# Patient Record
Sex: Male | Born: 1965 | Race: White | Hispanic: No | Marital: Married | Smoking: Former smoker
Health system: Southern US, Community
[De-identification: ages and names within clinical notes are randomized; demographics above are authoritative.]

## PROBLEM LIST (undated history)

## (undated) DIAGNOSIS — Z87442 Personal history of urinary calculi: Secondary | ICD-10-CM

## (undated) DIAGNOSIS — Z8719 Personal history of other diseases of the digestive system: Secondary | ICD-10-CM

## (undated) DIAGNOSIS — M1990 Unspecified osteoarthritis, unspecified site: Secondary | ICD-10-CM

## (undated) DIAGNOSIS — N400 Enlarged prostate without lower urinary tract symptoms: Secondary | ICD-10-CM

## (undated) HISTORY — DX: Benign prostatic hyperplasia without lower urinary tract symptoms: N40.0

## (undated) HISTORY — PX: TONSILLECTOMY: SUR1361

## (undated) HISTORY — PX: WISDOM TOOTH EXTRACTION: SHX21

## (undated) HISTORY — PX: HERNIA REPAIR: SHX51

---

## 1991-05-03 HISTORY — PX: HYDROCELE EXCISION / REPAIR: SUR1145

## 2007-05-03 HISTORY — PX: UMBILICAL HERNIA REPAIR: SHX196

## 2019-01-21 DIAGNOSIS — Z6824 Body mass index (BMI) 24.0-24.9, adult: Secondary | ICD-10-CM | POA: Diagnosis not present

## 2019-01-21 DIAGNOSIS — N5089 Other specified disorders of the male genital organs: Secondary | ICD-10-CM | POA: Diagnosis not present

## 2019-01-22 DIAGNOSIS — N4340 Spermatocele of epididymis, unspecified: Secondary | ICD-10-CM | POA: Diagnosis not present

## 2019-01-22 DIAGNOSIS — N5089 Other specified disorders of the male genital organs: Secondary | ICD-10-CM | POA: Diagnosis not present

## 2019-01-22 DIAGNOSIS — N521 Erectile dysfunction due to diseases classified elsewhere: Secondary | ICD-10-CM | POA: Diagnosis not present

## 2019-01-22 DIAGNOSIS — N50812 Left testicular pain: Secondary | ICD-10-CM | POA: Diagnosis not present

## 2019-02-05 DIAGNOSIS — N503 Cyst of epididymis: Secondary | ICD-10-CM | POA: Diagnosis not present

## 2019-02-05 DIAGNOSIS — I861 Scrotal varices: Secondary | ICD-10-CM | POA: Diagnosis not present

## 2019-02-06 DIAGNOSIS — N5089 Other specified disorders of the male genital organs: Secondary | ICD-10-CM | POA: Diagnosis not present

## 2019-02-06 DIAGNOSIS — Z6826 Body mass index (BMI) 26.0-26.9, adult: Secondary | ICD-10-CM | POA: Diagnosis not present

## 2019-02-28 DIAGNOSIS — N50812 Left testicular pain: Secondary | ICD-10-CM | POA: Diagnosis not present

## 2019-02-28 DIAGNOSIS — N528 Other male erectile dysfunction: Secondary | ICD-10-CM | POA: Diagnosis not present

## 2019-02-28 DIAGNOSIS — N4340 Spermatocele of epididymis, unspecified: Secondary | ICD-10-CM | POA: Diagnosis not present

## 2019-02-28 DIAGNOSIS — I861 Scrotal varices: Secondary | ICD-10-CM | POA: Diagnosis not present

## 2019-07-15 ENCOUNTER — Encounter: Payer: Self-pay | Admitting: Emergency Medicine

## 2019-07-15 ENCOUNTER — Other Ambulatory Visit: Payer: Self-pay

## 2019-07-15 ENCOUNTER — Ambulatory Visit (INDEPENDENT_AMBULATORY_CARE_PROVIDER_SITE_OTHER): Payer: Worker's Compensation

## 2019-07-15 ENCOUNTER — Ambulatory Visit
Admission: EM | Admit: 2019-07-15 | Discharge: 2019-07-15 | Disposition: A | Discharge status: Home / Self Care | Payer: Worker's Compensation | Attending: Family Medicine | Admitting: Family Medicine

## 2019-07-15 DIAGNOSIS — M25522 Pain in left elbow: Secondary | ICD-10-CM | POA: Diagnosis not present

## 2019-07-15 DIAGNOSIS — Z042 Encounter for examination and observation following work accident: Secondary | ICD-10-CM

## 2019-07-15 DIAGNOSIS — S7012XA Contusion of left thigh, initial encounter: Secondary | ICD-10-CM

## 2019-07-15 DIAGNOSIS — T07XXXA Unspecified multiple injuries, initial encounter: Secondary | ICD-10-CM | POA: Diagnosis not present

## 2019-07-15 DIAGNOSIS — S40012A Contusion of left shoulder, initial encounter: Secondary | ICD-10-CM

## 2019-07-15 DIAGNOSIS — M25512 Pain in left shoulder: Secondary | ICD-10-CM

## 2019-07-15 DIAGNOSIS — W108XXA Fall (on) (from) other stairs and steps, initial encounter: Secondary | ICD-10-CM

## 2019-07-15 DIAGNOSIS — M79605 Pain in left leg: Secondary | ICD-10-CM

## 2019-07-15 DIAGNOSIS — S5002XA Contusion of left elbow, initial encounter: Secondary | ICD-10-CM

## 2019-07-15 MED ORDER — MELOXICAM 15 MG PO TABS: 15.0000 mg | ORAL_TABLET | Freq: Every day | ORAL | 0 refills | Status: DC

## 2019-07-15 NOTE — ED Triage Notes (Signed)
Pt was at work and fell off steps coming from a machine. His left shoulder, elbow and thigh are hurting. He can move his joints but is painful.

## 2019-07-15 NOTE — ED Provider Notes (Signed)
[facility]    CSN: [id number] Arrival date & time: 07/15/19  1802      History   Chief Complaint Chief Complaint  Patient presents with  . Fall    w/c    HPI Timothy Cook is a 54 y.o. male.   HPI  54 year old male today at work, was coming down some stairs on a machine when he hit the bottom stairs he stepped off and slipped on a wet slippery area on the concrete.  He fell suddenly onto his left shoulder elbow and thigh.  He states at first it was not hurting as badly as it is now.  He has been able to walk.  He denies any injury to his head had no loss of consciousness.        History reviewed. No pertinent past medical history.  There are no problems to display for this patient.   Past Surgical History:  Procedure Laterality Date  . HERNIA REPAIR    . HYDROCELE EXCISION / REPAIR    . TONSILLECTOMY         Home Medications    Prior to Admission medications   Medication Sig Start Date End Date Taking? Authorizing Provider  meloxicam (MOBIC) 15 MG tablet Take 1 tablet (15 mg total) by mouth daily. Take with food. 07/15/19   Lorin Picket, PA-C    Family History Family History  Problem Relation Age of Onset  . Cancer Mother        lung, Liver and bone  . Heart attack Mother   . Cancer Father        prostate    Social History Social History   Tobacco Use  . Smoking status: Current Every Day Smoker    Packs/day: 0.75  . Smokeless tobacco: Never Used  Substance Use Topics  . Alcohol use: Not Currently  . Drug use: Not Currently     Allergies   Patient has no known allergies.   Review of Systems Review of Systems  Constitutional: Positive for activity change. Negative for appetite change, chills, diaphoresis, fatigue and fever.  Musculoskeletal: Positive for arthralgias and gait problem.  All other systems reviewed and are negative.    Physical Exam Triage Vital Signs ED Triage Vitals  Enc Vitals Group     BP 07/15/19  1841 140/76     Pulse Rate 07/15/19 1841 (!) 57     Resp 07/15/19 1841 18     Temp 07/15/19 1841 97.9 F (36.6 C)     Temp Source 07/15/19 1841 Oral     SpO2 07/15/19 1841 98 %     Weight 07/15/19 1835 202 lb (91.6 kg)     Height 07/15/19 1835 6\' 2"  (1.88 m)     Head Circumference --      Peak Flow --      Pain Score 07/15/19 1835 6     Pain Loc --      Pain Edu? --      Excl. in [place]? --    No data found.  Updated Vital Signs BP 140/76 (BP Location: Left Arm)   Pulse (!) 57   Temp 97.9 F (36.6 C) (Oral)   Resp 18   Ht 6\' 2"  (1.88 m)   Wt 202 lb (91.6 kg)   SpO2 98%   BMI 25.94 kg/m   Visual Acuity Right Eye Distance:   Left Eye Distance:   Bilateral Distance:    Right Eye Near:   Left  Eye Near:    Bilateral Near:     Physical Exam Vitals and nursing note reviewed.  Constitutional:      General: He is not in acute distress.    Appearance: Normal appearance. He is normal weight. He is not ill-appearing or toxic-appearing.  HENT:     Head: Normocephalic and atraumatic.  Eyes:     Conjunctiva/sclera: Conjunctivae normal.  Musculoskeletal:        General: Tenderness and signs of injury present. No swelling or deformity.     Cervical back: Tenderness present.     Comments: Examination shows the patient to be in no acute distress.  He does favor his left side by holding his shoulder in a abducted and internally rotated posture.  He does show a moderate antalgic gait on the left.  No deformities seen.  There is no open areas of his integumentary.  Lamination of the cervical spine shows a fairly good range of motion with discomfort at the extremes of extension and flexion and leftward rotation.  The patient states that this has been a problem prior to the [employer] injury with neck pain and particularly in the left-sided paraspinous muscles.  Lamination of the apical shows no deformity no tenderness.  He has no tenderness about the [facility] joint or the anterior shoulder.   Is tenderness over the anterior scapular spine and the deep muscles.  Range of motion of the shoulder is to be restricted by patient resisting full abduction.  Has good rotation internal and external.  Lamination of the elbow shows no deformity.  There is no ecchymosis or bruising at the present time.  He has good extension and flexion pronation supination.  He has tenderness over the medial epicondyle and a Tinel's sign of the ulnar nerve in the groove with radiation into his ulnar forearm.  Examination of the lumbar spine shows no significant tenderness present.  There is no tenderness of the anterior superior iliac crests.  His hip shows fairly good range of motion to internal and external rotation.  He is tender along the lateral aspect of the femur at the midportion.  There is no deformity no crepitus present.  Skin:    General: Skin is warm and dry.  Neurological:     General: No focal deficit present.     Mental Status: He is alert and oriented to person, place, and time.  Psychiatric:        Mood and Affect: Mood normal.        Behavior: Behavior normal.        Thought Content: Thought content normal.        Judgment: Judgment normal.      UC Treatments / Results  Labs (all labs ordered are listed, but only abnormal results are displayed) Labs Reviewed - No data to display  EKG   Radiology DG Elbow Complete Left  Result Date: 07/15/2019 CLINICAL DATA:  54 year old male with fall and trauma to the left upper extremity. EXAM: LEFT SHOULDER - 2+ VIEW; LEFT ELBOW - COMPLETE 3+ VIEW COMPARISON:  None. FINDINGS: There is no acute fracture or dislocation. The bones are well mineralized. No significant arthritic changes. No joint effusion. The soft tissues are unremarkable. IMPRESSION: Negative. Electronically Signed   By: Anner Crete M.D.   On: 07/15/2019 19:51   DG Shoulder Left  Result Date: 07/15/2019 CLINICAL DATA:  54 year old male with fall and trauma to the left upper  extremity. EXAM: LEFT SHOULDER - 2+ VIEW; LEFT  ELBOW - COMPLETE 3+ VIEW COMPARISON:  None. FINDINGS: There is no acute fracture or dislocation. The bones are well mineralized. No significant arthritic changes. No joint effusion. The soft tissues are unremarkable. IMPRESSION: Negative. Electronically Signed   By: Anner Crete M.D.   On: 07/15/2019 19:51   DG Femur Min 2 Views Left  Result Date: 07/15/2019 CLINICAL DATA:  54 year old male with fall and trauma to the left lower extremity. EXAM: LEFT FEMUR 2 VIEWS COMPARISON:  None. FINDINGS: There is no acute fracture or dislocation. The bones are well mineralized. No significant arthritic changes. No joint effusion. There is a 2.5 cm ovoid calcific density over the pelvis which is not characterized but may represent a bladder stone. Bladder ultrasound may provide better evaluation. The soft tissues are unremarkable. IMPRESSION: 1. No acute fracture or dislocation. 2. Ovoid calcific density over the pelvis may represent a bladder stone. Electronically Signed   By: Anner Crete M.D.   On: 07/15/2019 19:53    Procedures Procedures (including critical care time)  Medications Ordered in UC Medications - No data to display  Initial Impression / Assessment and Plan / UC Course  I have reviewed the triage vital signs and the nursing notes.  Pertinent labs & imaging results that were available during my care of the patient were reviewed by me and considered in my medical decision making (see chart for details).   54 year old male presents today after falling at work onto his left side injuring his shoulder elbow and thigh.  Physical exam showed no significant contusions or ecchymosis.  There are no breaks in the skin.. I independently reviewed the x-rays of the elbow femur and shoulder.  No fractures or dislocations were seen.  Incidentally was seen a calcification possibly a bladder stone.  These were confirmed by radiology later.  I had discussed  the x-rays with the patient.  I will provide him with Mobic for pain control.  He will ice the areas as necessary for pain control.  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  He continues to have problems he will follow-up with [facility] occupational health. Final Clinical Impressions(s) / UC Diagnoses   Final diagnoses:  Multiple contusions     Discharge Instructions     Use ice 20 minutes every 2 hours 4-5 times daily on all areas that are painful.  Take meloxicam with food.  Return to work on 07/17/2019.  If you continue to have pain follow-up with the occupational health at [facility] as noted above.    ED Prescriptions    Medication Sig Dispense Auth. Provider   meloxicam (MOBIC) 15 MG tablet Take 1 tablet (15 mg total) by mouth daily. Take with food. 30 tablet Lorin Picket, PA-C     PDMP not reviewed this encounter.   Lorin Picket, PA-C 07/15/19 2024

## 2019-07-15 NOTE — Discharge Instructions (Addendum)
Use ice 20 minutes every 2 hours 4-5 times daily on all areas that are painful.  Take meloxicam with food.  Return to work on 07/17/2019.  If you continue to have pain follow-up with the occupational health at [facility] as noted above.

## 2019-08-01 DIAGNOSIS — Z23 Encounter for immunization: Secondary | ICD-10-CM | POA: Diagnosis not present

## 2019-08-23 DIAGNOSIS — Z23 Encounter for immunization: Secondary | ICD-10-CM | POA: Diagnosis not present

## 2019-12-27 ENCOUNTER — Ambulatory Visit: Payer: Self-pay | Admitting: Family Medicine

## 2020-01-07 ENCOUNTER — Telehealth: Payer: Self-pay | Admitting: Family Medicine

## 2020-01-07 DIAGNOSIS — Z125 Encounter for screening for malignant neoplasm of prostate: Secondary | ICD-10-CM

## 2020-01-07 DIAGNOSIS — Z13220 Encounter for screening for lipoid disorders: Secondary | ICD-10-CM

## 2020-01-07 DIAGNOSIS — Z79899 Other long term (current) drug therapy: Secondary | ICD-10-CM

## 2020-01-07 NOTE — Telephone Encounter (Signed)
Pt has cpe scheduled 11/1 and would like lab work done before appt.

## 2020-01-07 NOTE — Telephone Encounter (Signed)
No labs in system

## 2020-01-07 NOTE — Telephone Encounter (Signed)
Lipid, CMP, PSA, CBC

## 2020-01-08 NOTE — Telephone Encounter (Signed)
Lab orders placed. Attempted to contact pt but voicemail is full

## 2020-01-10 ENCOUNTER — Ambulatory Visit: Payer: Self-pay | Admitting: Family Medicine

## 2020-03-02 ENCOUNTER — Ambulatory Visit (INDEPENDENT_AMBULATORY_CARE_PROVIDER_SITE_OTHER): Payer: BC Managed Care – PPO | Admitting: Family Medicine

## 2020-03-02 ENCOUNTER — Other Ambulatory Visit: Payer: Self-pay

## 2020-03-02 VITALS — BP 118/78 | Temp 98.1°F | Ht 74.0 in | Wt 197.8 lb

## 2020-03-02 DIAGNOSIS — Z13220 Encounter for screening for lipoid disorders: Secondary | ICD-10-CM | POA: Diagnosis not present

## 2020-03-02 DIAGNOSIS — N529 Male erectile dysfunction, unspecified: Secondary | ICD-10-CM

## 2020-03-02 DIAGNOSIS — Z0000 Encounter for general adult medical examination without abnormal findings: Secondary | ICD-10-CM | POA: Diagnosis not present

## 2020-03-02 DIAGNOSIS — R3912 Poor urinary stream: Secondary | ICD-10-CM

## 2020-03-02 DIAGNOSIS — Z1211 Encounter for screening for malignant neoplasm of colon: Secondary | ICD-10-CM

## 2020-03-02 DIAGNOSIS — Z720 Tobacco use: Secondary | ICD-10-CM

## 2020-03-02 DIAGNOSIS — N401 Enlarged prostate with lower urinary tract symptoms: Secondary | ICD-10-CM | POA: Insufficient documentation

## 2020-03-02 DIAGNOSIS — Z79899 Other long term (current) drug therapy: Secondary | ICD-10-CM | POA: Diagnosis not present

## 2020-03-02 DIAGNOSIS — Z125 Encounter for screening for malignant neoplasm of prostate: Secondary | ICD-10-CM | POA: Diagnosis not present

## 2020-03-02 MED ORDER — TADALAFIL 5 MG PO TABS: ORAL_TABLET | ORAL | 5 refills | Status: DC

## 2020-03-02 MED ORDER — SILDENAFIL CITRATE 100 MG PO TABS: 50.0000 mg | ORAL_TABLET | Freq: Every day | ORAL | 11 refills | Status: DC | PRN

## 2020-03-02 MED ORDER — BUPROPION HCL ER (SR) 150 MG PO TB12: 150.0000 mg | ORAL_TABLET | Freq: Two times a day (BID) | ORAL | 2 refills | Status: DC
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
## 2020-03-02 NOTE — Patient Instructions (Signed)
Steps to Quit Smoking Smoking tobacco is the leading cause of preventable death. It can affect almost every organ in the body. Smoking puts you and people around you at risk for many serious, long-lasting (chronic) diseases. Quitting smoking can be hard, but it is one of the best things that you can do for your health. It is never too late to quit. How do I get ready to quit? When you decide to quit smoking, make a plan to help you succeed. Before you quit:  Pick a date to quit. Set a date within the next 2 weeks to give you time to prepare.  Write down the reasons why you are quitting. Keep this list in places where you will see it often.  Tell your family, friends, and co-workers that you are quitting. Their support is important.  Talk with your doctor about the choices that may help you quit.  Find out if your health insurance will pay for these treatments.  Know the people, places, things, and activities that make you want to smoke (triggers). Avoid them. What first steps can I take to quit smoking?  Throw away all cigarettes at home, at work, and in your car.  Throw away the things that you use when you smoke, such as ashtrays and lighters.  Clean your car. Make sure to empty the ashtray.  Clean your home, including curtains and carpets. What can I do to help me quit smoking? Talk with your doctor about taking medicines and seeing a counselor at the same time. You are more likely to succeed when you do both.  If you are pregnant or breastfeeding, talk with your doctor about counseling or other ways to quit smoking. Do not take medicine to help you quit smoking unless your doctor tells you to do so. To quit smoking: Quit right away  Quit smoking totally, instead of slowly cutting back on how much you smoke over a period of time.  Go to counseling. You are more likely to quit if you go to counseling sessions regularly. Take medicine You may take medicines to help you quit. Some  medicines need a prescription, and some you can buy over-the-counter. Some medicines may contain a drug called nicotine to replace the nicotine in cigarettes. Medicines may:  Help you to stop having the desire to smoke (cravings).  Help to stop the problems that come when you stop smoking (withdrawal symptoms). Your doctor may ask you to use:  Nicotine patches, gum, or lozenges.  Nicotine inhalers or sprays.  Non-nicotine medicine that is taken by mouth. Find resources Find resources and other ways to help you quit smoking and remain smoke-free after you quit. These resources are most helpful when you use them often. They include:  Online chats with a counselor.  Phone quitlines.  Printed self-help materials.  Support groups or group counseling.  Text messaging programs.  Mobile phone apps. Use apps on your mobile phone or tablet that can help you stick to your quit plan. There are many free apps for mobile phones and tablets as well as websites. Examples include Quit Guide from the CDC and smokefree.gov  What things can I do to make it easier to quit?   Talk to your family and friends. Ask them to support and encourage you.  Call a phone quitline (1-800-QUIT-NOW), reach out to support groups, or work with a counselor.  Ask people who smoke to not smoke around you.  Avoid places that make you want to smoke,   such as: ? Bars. ? Parties. ? Smoke-break areas at work.  Spend time with people who do not smoke.  Lower the stress in your life. Stress can make you want to smoke. Try these things to help your stress: ? Getting regular exercise. ? Doing deep-breathing exercises. ? Doing yoga. ? Meditating. ? Doing a body scan. To do this, close your eyes, focus on one area of your body at a time from head to toe. Notice which parts of your body are tense. Try to relax the muscles in those areas. How will I feel when I quit smoking? Day 1 to 3 weeks Within the first 24 hours,  you may start to have some problems that come from quitting tobacco. These problems are very bad 2-3 days after you quit, but they do not often last for more than 2-3 weeks. You may get these symptoms:  Mood swings.  Feeling restless, nervous, angry, or annoyed.  Trouble concentrating.  Dizziness.  Strong desire for high-sugar foods and nicotine.  Weight gain.  Trouble pooping (constipation).  Feeling like you may vomit (nausea).  Coughing or a sore throat.  Changes in how the medicines that you take for other issues work in your body.  Depression.  Trouble sleeping (insomnia). Week 3 and afterward After the first 2-3 weeks of quitting, you may start to notice more positive results, such as:  Better sense of smell and taste.  Less coughing and sore throat.  Slower heart rate.  Lower blood pressure.  Clearer skin.  Better breathing.  Fewer sick days. Quitting smoking can be hard. Do not give up if you fail the first time. Some people need to try a few times before they succeed. Do your best to stick to your quit plan, and talk with your doctor if you have any questions or concerns. Summary  Smoking tobacco is the leading cause of preventable death. Quitting smoking can be hard, but it is one of the best things that you can do for your health.  When you decide to quit smoking, make a plan to help you succeed.  Quit smoking right away, not slowly over a period of time.  When you start quitting, seek help from your doctor, family, or friends. This information is not intended to replace advice given to you by your health care provider. Make sure you discuss any questions you have with your health care provider. Document Revised: 01/11/2019 Document Reviewed: 07/07/2018 Elsevier Patient Education  2020 [employer] Inc[employer]  

## 2020-03-02 NOTE — Addendum Note (Signed)
Addended by: Dairl Ponder on: 03/02/2020 03:52 PM   Modules accepted: Orders

## 2020-03-03 LAB — CBC WITH DIFFERENTIAL/PLATELET
Basophils Absolute: 0.1 10*3/uL (ref 0.0–0.2)
Basos: 1 %
EOS (ABSOLUTE): 0.2 10*3/uL (ref 0.0–0.4)
Eos: 1 %
Hematocrit: 49.9 % (ref 37.5–51.0)
Hemoglobin: 17 g/dL (ref 13.0–17.7)
Immature Grans (Abs): 0.3 10*3/uL — ABNORMAL HIGH (ref 0.0–0.1)
Immature Granulocytes: 2 %
Lymphocytes Absolute: 1.7 10*3/uL (ref 0.7–3.1)
Lymphs: 11 %
MCH: 31.8 pg (ref 26.6–33.0)
MCHC: 34.1 g/dL (ref 31.5–35.7)
MCV: 93 fL (ref 79–97)
Monocytes Absolute: 1.2 10*3/uL — ABNORMAL HIGH (ref 0.1–0.9)
Monocytes: 8 %
Neutrophils Absolute: 11.9 10*3/uL — ABNORMAL HIGH (ref 1.4–7.0)
Neutrophils: 77 %
RBC: 5.35 x10E6/uL (ref 4.14–5.80)
RDW: 12.5 % (ref 11.6–15.4)
WBC: 15.4 10*3/uL — ABNORMAL HIGH (ref 3.4–10.8)

## 2020-03-03 LAB — COMPREHENSIVE METABOLIC PANEL
ALT: 10 IU/L (ref 0–44)
AST: 15 IU/L (ref 0–40)
Albumin/Globulin Ratio: 1.7 (ref 1.2–2.2)
Albumin: 4.5 g/dL (ref 3.8–4.9)
Alkaline Phosphatase: 62 IU/L (ref 44–121)
BUN/Creatinine Ratio: 12 (ref 9–20)
BUN: 14 mg/dL (ref 6–24)
Bilirubin Total: 0.3 mg/dL (ref 0.0–1.2)
CO2: 26 mmol/L (ref 20–29)
Calcium: 9.5 mg/dL (ref 8.7–10.2)
Chloride: 100 mmol/L (ref 96–106)
Creatinine, Ser: 1.15 mg/dL (ref 0.76–1.27)
GFR calc Af Amer: 82 mL/min/{1.73_m2} (ref >59)
GFR calc non Af Amer: 71 mL/min/{1.73_m2} (ref >59)
Globulin, Total: 2.6 g/dL (ref 1.5–4.5)
Glucose: 98 mg/dL (ref 65–99)
Potassium: 4.7 mmol/L (ref 3.5–5.2)
Sodium: 139 mmol/L (ref 134–144)
Total Protein: 7.1 g/dL (ref 6.0–8.5)

## 2020-03-03 LAB — LIPID PANEL
Chol/HDL Ratio: 5.6 ratio — ABNORMAL HIGH (ref 0.0–5.0)
Cholesterol, Total: 248 mg/dL — ABNORMAL HIGH (ref 100–199)
HDL: 44 mg/dL (ref >39)
LDL Chol Calc (NIH): 188 mg/dL — ABNORMAL HIGH (ref 0–99)
Triglycerides: 91 mg/dL (ref 0–149)
VLDL Cholesterol Cal: 16 mg/dL (ref 5–40)

## 2020-03-03 LAB — PSA: Prostate Specific Ag, Serum: 0.6 ng/mL (ref 0.0–4.0)

## 2020-03-19 ENCOUNTER — Encounter: Payer: Self-pay | Admitting: Internal Medicine

## 2020-03-24 ENCOUNTER — Ambulatory Visit: Payer: BC Managed Care – PPO | Admitting: Family Medicine

## 2020-04-15 ENCOUNTER — Ambulatory Visit: Payer: BC Managed Care – PPO

## 2020-04-15 ENCOUNTER — Other Ambulatory Visit: Payer: Self-pay

## 2020-04-15 VITALS — Ht 74.0 in | Wt 201.0 lb

## 2020-04-15 DIAGNOSIS — Z1211 Encounter for screening for malignant neoplasm of colon: Secondary | ICD-10-CM

## 2020-04-15 MED ORDER — NA SULFATE-K SULFATE-MG SULF 17.5-3.13-1.6 GM/177ML PO SOLN: 1.0000 | Freq: Once | ORAL | 0 refills | Status: AC

## 2020-04-15 NOTE — Progress Notes (Signed)
No egg or soy allergy known to patient  No issues with past sedation with any surgeries or procedures No intubation problems in the past  No FH of Malignant Hyperthermia No diet pills per patient No home 02 use per patient  No blood thinners per patient  Pt denies issues with constipation  No A fib or A flutter  EMMI video via [place] 19 guidelines implemented in PV today with Pt and RN  Pt is fully vaccinated  for Covid  Coupon given to pt in PV today , Code to Pharmacy   Due to the COVID-19 pandemic we are asking patients to follow certain guidelines.  Pt aware of COVID protocols and [facility] guidelines

## 2020-04-20 ENCOUNTER — Encounter: Payer: Self-pay | Admitting: Family Medicine

## 2020-04-20 ENCOUNTER — Ambulatory Visit (INDEPENDENT_AMBULATORY_CARE_PROVIDER_SITE_OTHER): Payer: BC Managed Care – PPO | Admitting: Family Medicine

## 2020-04-20 ENCOUNTER — Other Ambulatory Visit: Payer: Self-pay

## 2020-04-20 VITALS — BP 126/88 | HR 74 | Temp 97.9°F | Wt 211.8 lb

## 2020-04-20 DIAGNOSIS — D72829 Elevated white blood cell count, unspecified: Secondary | ICD-10-CM | POA: Diagnosis not present

## 2020-04-20 DIAGNOSIS — E785 Hyperlipidemia, unspecified: Secondary | ICD-10-CM | POA: Diagnosis not present

## 2020-04-20 MED ORDER — TADALAFIL 5 MG PO TABS: ORAL_TABLET | ORAL | 5 refills | Status: DC

## 2020-04-20 NOTE — Patient Instructions (Addendum)
Please do lipid profile in 3 months at [place] [facility] Wellbutrin We will look into Chantix to see if it is still under recall  Results for orders placed or performed in visit on 01/07/20  Lipid Profile  Result Value Ref Range   Cholesterol, Total 248 (H) 100 - 199 mg/dL   Triglycerides 91 0 - 149 mg/dL   HDL 44 >39 mg/dL   VLDL Cholesterol Cal 16 5 - 40 mg/dL   LDL Chol Calc (NIH) 188 (H) 0 - 99 mg/dL   Chol/HDL Ratio 5.6 (H) 0.0 - 5.0 ratio  Comprehensive Metabolic Panel (CMET)  Result Value Ref Range   Glucose 98 65 - 99 mg/dL   BUN 14 6 - 24 mg/dL   Creatinine, Ser 1.15 0.76 - 1.27 mg/dL   GFR calc non Af Amer 71 >59 mL/min/1.73   GFR calc Af Amer 82 >59 mL/min/1.73   BUN/Creatinine Ratio 12 9 - 20   Sodium 139 134 - 144 mmol/L   Potassium 4.7 3.5 - 5.2 mmol/L   Chloride 100 96 - 106 mmol/L   CO2 26 20 - 29 mmol/L   Calcium 9.5 8.7 - 10.2 mg/dL   Total Protein 7.1 6.0 - 8.5 g/dL   Albumin 4.5 3.8 - 4.9 g/dL   Globulin, Total 2.6 1.5 - 4.5 g/dL   Albumin/Globulin Ratio 1.7 1.2 - 2.2   Bilirubin Total 0.3 0.0 - 1.2 mg/dL   Alkaline Phosphatase 62 44 - 121 IU/L   AST 15 0 - 40 IU/L   ALT 10 0 - 44 IU/L  PSA  Result Value Ref Range   Prostate Specific Ag, Serum 0.6 0.0 - 4.0 ng/mL  CBC with Differential  Result Value Ref Range   WBC 15.4 (H) 3.4 - 10.8 x10E3/uL   RBC 5.35 4.14 - 5.80 x10E6/uL   Hemoglobin 17.0 13.0 - 17.7 g/dL   Hematocrit 49.9 37.5 - 51.0 %   MCV 93 79 - 97 fL   MCH 31.8 26.6 - 33.0 pg   MCHC 34.1 31.5 - 35.7 g/dL   RDW 12.5 11.6 - 15.4 %   Platelets CANCELED x10E3/uL   Neutrophils 77 Not Estab. %   Lymphs 11 Not Estab. %   Monocytes 8 Not Estab. %   Eos 1 Not Estab. %   Basos 1 Not Estab. %   Neutrophils Absolute 11.9 (H) 1.4 - 7.0 x10E3/uL   Lymphocytes Absolute 1.7 0.7 - 3.1 x10E3/uL   Monocytes Absolute 1.2 (H) 0.1 - 0.9 x10E3/uL   EOS (ABSOLUTE) 0.2 0.0 - 0.4 x10E3/uL   Basophils Absolute 0.1 0.0 - 0.2 x10E3/uL   Immature Granulocytes 2  Not Estab. %   Immature Grans (Abs) 0.3 (H) 0.0 - 0.1 x10E3/uL   Hematology Comments: Note:       Preventing High Cholesterol Cholesterol is a white, waxy substance similar to fat that the human body needs to help build cells. The liver makes all the cholesterol that a person's body needs. Having high cholesterol (hypercholesterolemia) increases a person's risk for heart disease and stroke. Extra (excess) cholesterol comes from the food the person eats. High cholesterol can often be prevented with diet and lifestyle changes. If you already have high cholesterol, you can control it with diet and lifestyle changes and with medicine. How can high cholesterol affect me? If you have high cholesterol, deposits (plaques) may build up on the walls of your arteries. The arteries are the blood vessels that carry blood away from your heart. Plaques make  the arteries narrower and stiffer. This can limit or block blood flow and cause blood clots to form. Blood clots:  Are tiny balls of cells that form in your blood.  Can move to the heart or brain, causing a heart attack or stroke. Plaques in arteries greatly increase your risk for heart attack and stroke.Making diet and lifestyle changes can reduce your risk for these conditions that may threaten your life. What can increase my risk? This condition is more likely to develop in people who:  Eat foods that are high in saturated fat or cholesterol. Saturated fat is mostly found in: ? Foods that contain animal fat, such as red meat and some dairy products. ? Certain fatty foods made from plants, such as tropical oils.  Are overweight.  Are not getting enough exercise.  Have a family history of high cholesterol. What actions can I take to prevent this? Nutrition   Eat less saturated fat.  Avoid trans fats (partially hydrogenated oils). These are often found in margarine and in some baked goods, fried foods, and snacks bought in packages.  Avoid  precooked or cured meat, such as sausages or meat loaves.  Avoid foods and drinks that have added sugars.  Eat more fruits, vegetables, and whole grains.  Choose healthy sources of protein, such as fish, poultry, lean cuts of red meat, beans, peas, lentils, and nuts.  Choose healthy sources of fat, such as: ? Nuts. ? Vegetable oils, especially olive oil. ? Fish that have healthy fats (omega-3 fatty acids), such as mackerel or salmon. The items listed above may not be a complete list of recommended foods and beverages. Contact a dietitian for more information. Lifestyle  Lose weight if you are overweight. Losing 5-10 lb (2.3-4.5 kg) can help prevent or control high cholesterol. It can also lower your risk for diabetes and high blood pressure. Ask your health care provider to help you with a diet and exercise plan to lose weight safely.  Do not use any products that contain nicotine or tobacco, such as cigarettes, e-cigarettes, and chewing tobacco. If you need help quitting, ask your health care provider.  Limit your alcohol intake. ? Do not drink alcohol if:  Your health care provider tells you not to drink.  You are pregnant, may be pregnant, or are planning to become pregnant. ? If you drink alcohol:  Limit how much you use to:  0-1 drink a day for women.  0-2 drinks a day for men.  Be aware of how much alcohol is in your drink. In the [place] one drink equals one 12 oz bottle of beer (355 mL), one 5 oz glass of wine (148 mL), or one 1 oz glass of hard liquor (44 mL). Activity   Get enough exercise. Each week, do at least 150 minutes of exercise that takes a medium level of effort (moderate-intensity exercise). ? This is exercise that:  Makes your heart beat faster and makes you breathe harder than usual.  Allows you to still be able to talk. ? You could exercise in short sessions several times a day or longer sessions a few times a week. For example, on 5 days each week,  you could walk fast or ride your bike 3 times a day for 10 minutes each time.  Do exercises as told by your health care provider. Medicines  In addition to diet and lifestyle changes, your health care provider may recommend medicines to help lower cholesterol. This may be a medicine to  lower the amount of cholesterol your liver makes. You may need medicine if: ? Diet and lifestyle changes do not lower your cholesterol enough. ? You have high cholesterol and other risk factors for heart disease or stroke.  Take over-the-counter and prescription medicines only as told by your health care provider. General information  Manage your risk factors for high cholesterol. Talk with your health care provider about all your risk factors and how to lower your risk.  Manage other conditions that you have, such as diabetes or high blood pressure (hypertension).  Have blood tests to check your cholesterol levels at regular points in time as told by your health care provider.  Keep all follow-up visits as told by your health care provider. This is important. Where to find more information  American Heart Association: www.heart.org  National Heart, Lung, and Blood Institute: https://wilson-eaton.com/ Summary  High cholesterol increases your risk for heart disease and stroke. By keeping your cholesterol level low, you can reduce your risk for these conditions.  High cholesterol can often be prevented with diet and lifestyle changes.  Work with your health care provider to manage your risk factors, and have your blood tested regularly. This information is not intended to replace advice given to you by your health care provider. Make sure you discuss any questions you have with your health care provider. Document Revised: 08/10/2018 Document Reviewed: 12/26/2015 Elsevier Patient Education  [place]. Cholesterol Content in Foods Cholesterol is a waxy, fat-like substance that helps to carry fat in the  blood. The body needs cholesterol in small amounts, but too much cholesterol can cause damage to the arteries and heart. Most people should eat less than 200 milligrams (mg) of cholesterol a day. Foods with cholesterol  Cholesterol is found in animal-based foods, such as meat, seafood, and dairy. Generally, low-fat dairy and lean meats have less cholesterol than full-fat dairy and fatty meats. The milligrams of cholesterol per serving (mg per serving) of common cholesterol-containing foods are listed below. Meat and other proteins  Egg -- one large whole egg has 186 mg.  Veal shank -- 4 oz has 141 mg.  Lean ground [place] (93% lean) -- 4 oz has 118 mg.  Fat-trimmed lamb loin -- 4 oz has 106 mg.  Lean ground beef (90% lean) -- 4 oz has 100 mg.  Lobster -- 3.5 oz has 90 mg.  Pork loin chops -- 4 oz has 86 mg.  Canned salmon -- 3.5 oz has 83 mg.  Fat-trimmed beef top loin -- 4 oz has 78 mg.  Frankfurter -- 1 frank (3.5 oz) has 77 mg.  Crab -- 3.5 oz has 71 mg.  Roasted chicken without skin, white meat -- 4 oz has 66 mg.  Light bologna -- 2 oz has 45 mg.  Deli-cut [place] -- 2 oz has 31 mg.  Canned tuna -- 3.5 oz has 31 mg.  Berniece Salines -- 1 oz has 29 mg.  Oysters and mussels (raw) -- 3.5 oz has 25 mg.  Mackerel -- 1 oz has 22 mg.  Trout -- 1 oz has 20 mg.  Pork sausage -- 1 link (1 oz) has 17 mg.  Salmon -- 1 oz has 16 mg.  Tilapia -- 1 oz has 14 mg. Dairy  Soft-serve ice cream --  cup (4 oz) has 103 mg.  Whole-milk yogurt -- 1 cup (8 oz) has 29 mg.  Cheddar cheese -- 1 oz has 28 mg.  American cheese -- 1 oz has 28  mg.  Whole milk -- 1 cup (8 oz) has 23 mg.  2% milk -- 1 cup (8 oz) has 18 mg.  Cream cheese -- 1 tablespoon (Tbsp) has 15 mg.  Cottage cheese --  cup (4 oz) has 14 mg.  Low-fat (1%) milk -- 1 cup (8 oz) has 10 mg.  Sour cream -- 1 Tbsp has 8.5 mg.  Low-fat yogurt -- 1 cup (8 oz) has 8 mg.  Nonfat Greek yogurt -- 1 cup (8 oz) has 7  mg.  Half-and-half cream -- 1 Tbsp has 5 mg. Fats and oils  Cod liver oil -- 1 tablespoon (Tbsp) has 82 mg.  Butter -- 1 Tbsp has 15 mg.  Lard -- 1 Tbsp has 14 mg.  Bacon grease -- 1 Tbsp has 14 mg.  Mayonnaise -- 1 Tbsp has 5-10 mg.  Margarine -- 1 Tbsp has 3-10 mg. Exact amounts of cholesterol in these foods may vary depending on specific ingredients and brands. Foods without cholesterol Most plant-based foods do not have cholesterol unless you combine them with a food that has cholesterol. Foods without cholesterol include:  Grains and cereals.  Vegetables.  Fruits.  Vegetable oils, such as olive, canola, and sunflower oil.  Legumes, such as peas, beans, and lentils.  Nuts and seeds.  Egg whites. Summary  The body needs cholesterol in small amounts, but too much cholesterol can cause damage to the arteries and heart.  Most people should eat less than 200 milligrams (mg) of cholesterol a day. This information is not intended to replace advice given to you by your health care provider. Make sure you discuss any questions you have with your health care provider. Document Revised: 03/31/2017 Document Reviewed: 12/13/2016 Elsevier Patient Education  2020 [employer].  Steps to Quit Smoking Smoking tobacco is the leading cause of preventable death. It can affect almost every organ in the body. Smoking puts you and people around you at risk for many serious, long-lasting (chronic) diseases. Quitting smoking can be hard, but it is one of the best things that you can do for your health. It is never too late to quit. How do I get ready to quit? When you decide to quit smoking, make a plan to help you succeed. Before you quit:  Pick a date to quit. Set a date within the next 2 weeks to give you time to prepare.  Write down the reasons why you are quitting. Keep this list in places where you will see it often.  Tell your family, friends, and co-workers that you are  quitting. Their support is important.  Talk with your doctor about the choices that may help you quit.  Find out if your health insurance will pay for these treatments.  Know the people, places, things, and activities that make you want to smoke (triggers). Avoid them. What first steps can I take to quit smoking?  Throw away all cigarettes at home, at work, and in your car.  Throw away the things that you use when you smoke, such as ashtrays and lighters.  Clean your car. Make sure to empty the ashtray.  Clean your home, including curtains and carpets. What can I do to help me quit smoking? Talk with your doctor about taking medicines and seeing a counselor at the same time. You are more likely to succeed when you do both.  If you are pregnant or breastfeeding, talk with your doctor about counseling or other ways to quit smoking. Do not take  medicine to help you quit smoking unless your doctor tells you to do so. To quit smoking: Quit right away  Quit smoking totally, instead of slowly cutting back on how much you smoke over a period of time.  Go to counseling. You are more likely to quit if you go to counseling sessions regularly. Take medicine You may take medicines to help you quit. Some medicines need a prescription, and some you can buy over-the-counter. Some medicines may contain a drug called nicotine to replace the nicotine in cigarettes. Medicines may:  Help you to stop having the desire to smoke (cravings).  Help to stop the problems that come when you stop smoking (withdrawal symptoms). Your doctor may ask you to use:  Nicotine patches, gum, or lozenges.  Nicotine inhalers or sprays.  Non-nicotine medicine that is taken by mouth. Find resources Find resources and other ways to help you quit smoking and remain smoke-free after you quit. These resources are most helpful when you use them often. They include:  Online chats with a Social worker.  Phone  quitlines.  Printed Furniture conservator/restorer.  Support groups or group counseling.  Text messaging programs.  Mobile phone apps. Use apps on your mobile phone or tablet that can help you stick to your quit plan. There are many free apps for mobile phones and tablets as well as websites. Examples include Quit Guide from the [employer] and smokefree.gov  What things can I do to make it easier to quit?   Talk to your family and friends. Ask them to support and encourage you.  Call a phone quitline (1-800-QUIT-NOW), reach out to support groups, or work with a Social worker.  Ask people who smoke to not smoke around you.  Avoid places that make you want to smoke, such as: ? Bars. ? Parties. ? Smoke-break areas at work.  Spend time with people who do not smoke.  Lower the stress in your life. Stress can make you want to smoke. Try these things to help your stress: ? Getting regular exercise. ? Doing deep-breathing exercises. ? Doing yoga. ? Meditating. ? Doing a body scan. To do this, close your eyes, focus on one area of your body at a time from head to toe. Notice which parts of your body are tense. Try to relax the muscles in those areas. How will I feel when I quit smoking? Day 1 to 3 weeks Within the first 24 hours, you may start to have some problems that come from quitting tobacco. These problems are very bad 2-3 days after you quit, but they do not often last for more than 2-3 weeks. You may get these symptoms:  Mood swings.  Feeling restless, nervous, angry, or annoyed.  Trouble concentrating.  Dizziness.  Strong desire for high-sugar foods and nicotine.  Weight gain.  Trouble pooping (constipation).  Feeling like you may vomit (nausea).  Coughing or a sore throat.  Changes in how the medicines that you take for other issues work in your body.  Depression.  Trouble sleeping (insomnia). Week 3 and afterward After the first 2-3 weeks of quitting, you may start to notice  more positive results, such as:  Better sense of smell and taste.  Less coughing and sore throat.  Slower heart rate.  Lower blood pressure.  Clearer skin.  Better breathing.  Fewer sick days. Quitting smoking can be hard. Do not give up if you fail the first time. Some people need to try a few times before they succeed. Do  your best to stick to your quit plan, and talk with your doctor if you have any questions or concerns. Summary  Smoking tobacco is the leading cause of preventable death. Quitting smoking can be hard, but it is one of the best things that you can do for your health.  When you decide to quit smoking, make a plan to help you succeed.  Quit smoking right away, not slowly over a period of time.  When you start quitting, seek help from your doctor, family, or friends. This information is not intended to replace advice given to you by your health care provider. Make sure you discuss any questions you have with your health care provider. Document Revised: 01/11/2019 Document Reviewed: 07/07/2018 Elsevier Patient Education  [place].

## 2020-04-20 NOTE — Progress Notes (Signed)
   Subjective:    Patient ID: Timothy Cook, male    DOB: June 18, 1965, 54 y.o.   MRN: [id number]  Hyperlipidemia Pertinent negatives include no chest pain or shortness of breath.  Patient would like to discuss diets and lifestyle changes to improve cholesterol vs starting statin.   Hyperlipidemia, unspecified hyperlipidemia type - Plan: Lipid panel, CBC with Differential/Platelet  Leukocytosis, unspecified type - Plan: Lipid panel, CBC with Differential/Platelet  Patient feels that his leukocytosis was related to dental work that he had completed recently  He states he is no longer having any dental pain no fever chills.  We did discuss elevated cholesterol puts him at a 14.9% risk of heart disease in the next 10 years patient would like to try nonmedication approaches currently  We did discuss how he needs to quit smoking and stay away from smoking unfortunately Wellbutrin did not help so therefore stopped the Wellbutrin  Review of Systems  Constitutional: Negative for activity change, fatigue and fever.  HENT: Negative for congestion and rhinorrhea.   Respiratory: Negative for cough and shortness of breath.   Cardiovascular: Negative for chest pain and leg swelling.  Gastrointestinal: Negative for abdominal pain, diarrhea and nausea.  Genitourinary: Negative for dysuria and hematuria.  Neurological: Negative for weakness and headaches.  Psychiatric/Behavioral: Negative for agitation and behavioral problems.       Objective:   Physical Exam Vitals reviewed.  Constitutional:      Appearance: He is well-nourished.  Cardiovascular:     Rate and Rhythm: Normal rate and regular rhythm.     Heart sounds: Normal heart sounds. No murmur heard.   Pulmonary:     Effort: Pulmonary effort is normal.     Breath sounds: Normal breath sounds.  Musculoskeletal:        General: No edema.  Lymphadenopathy:     Cervical: No cervical adenopathy.  Neurological:     Mental Status: He is alert.   Psychiatric:        Behavior: Behavior normal.           Assessment & Plan:  1. Hyperlipidemia, unspecified hyperlipidemia type Patient prefers to try some dietary measures 1st he would like to recheck in 3 months if that does not do well he has a 2nd dietary measures he would like to try regarding possible keto diet and if that does not do things better in 6 months he is willing to start a statin patient is at risk of heart disease 14.9% risk healthy diet recommended patient defers on statins currently - Lipid panel - CBC with Differential/Platelet  2. Leukocytosis, unspecified type Recheck CBC but he feels that this was related to a dental infection he had at the time - Lipid panel - CBC with Differential/Platelet  Tobacco-patient needs to quit smoking.  We did discuss healthy diet Wellbutrin did not help we will see if Chantix is still on recall or not

## 2020-04-21 NOTE — Progress Notes (Signed)
04/21/20- Per [facility], Chantix is still on recall at this time. Please advise. Thank you

## 2020-05-03 NOTE — Progress Notes (Signed)
Please send patient a MyChart message letting him know that Chantix is still on recall

## 2020-05-04 ENCOUNTER — Encounter: Payer: Self-pay | Admitting: *Deleted

## 2020-05-09 ENCOUNTER — Other Ambulatory Visit: Payer: BC Managed Care – PPO

## 2020-05-09 ENCOUNTER — Other Ambulatory Visit: Payer: Self-pay

## 2020-05-09 DIAGNOSIS — Z20822 Contact with and (suspected) exposure to covid-19: Secondary | ICD-10-CM

## 2020-05-12 LAB — NOVEL CORONAVIRUS, NAA: SARS-CoV-2, NAA: NOT DETECTED

## 2020-05-14 ENCOUNTER — Telehealth: Payer: Self-pay | Admitting: Family Medicine

## 2020-05-14 NOTE — Telephone Encounter (Signed)
Based on phone note It appears that Rebecca's symptoms started last Wednesday  Self-isolation for quarantine falls into 2 categories  CDC does allow for essential workers to return to work after 5 days of quarantine as long as they wear a mask for 5 days Second option would be 10 days self quarantine, 10 days self quarantine is the current standard 14-day is no longer the standard  So therefore patient theoretically could return to work now wear a mask or could return to work on Monday Please see what they would like to do and help them out and have the front do the note-thanks

## 2020-05-14 NOTE — Telephone Encounter (Signed)
Pt in the same home as significant other who tested positive for COVID. Pt began to not feel good last week but COVID test was negative on Saturday. Pt is unable to return to work due to being in incubation period. Please advise. Thank you

## 2020-05-14 NOTE — Telephone Encounter (Signed)
Please give patient work excuse stating that he was exposed to a significant other who is covid positive as of last Wednesday. Please sent via MyChart. Thank you

## 2020-05-21 ENCOUNTER — Encounter: Payer: Self-pay | Admitting: Internal Medicine

## 2020-05-28 NOTE — Telephone Encounter (Signed)
Call patient twice to get dates never called back with dates.

## 2020-10-19 ENCOUNTER — Ambulatory Visit: Payer: BC Managed Care – PPO | Admitting: Family Medicine

## 2021-03-10 ENCOUNTER — Encounter: Payer: Self-pay | Admitting: Internal Medicine

## 2021-04-22 IMAGING — CR DG FEMUR 2+V*L*
4 series · 4 of 4 positions shown · non-contrast
Comparison: None.

CLINICAL DATA: 53-year-old male with fall and trauma to the left
lower extremity.

EXAM:
LEFT FEMUR 2 VIEWS

[femur ap (1 of 2)]
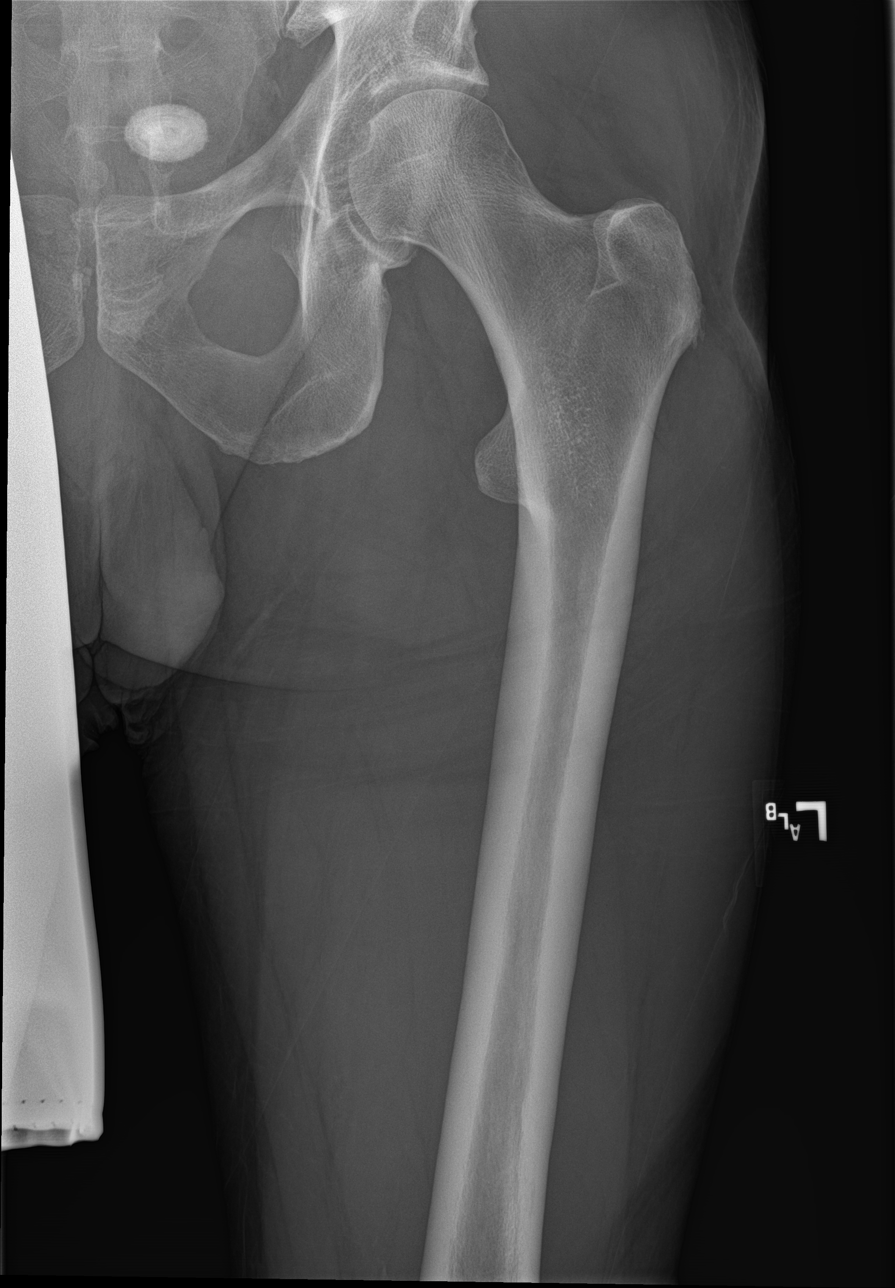

[femur ap (2 of 2)]
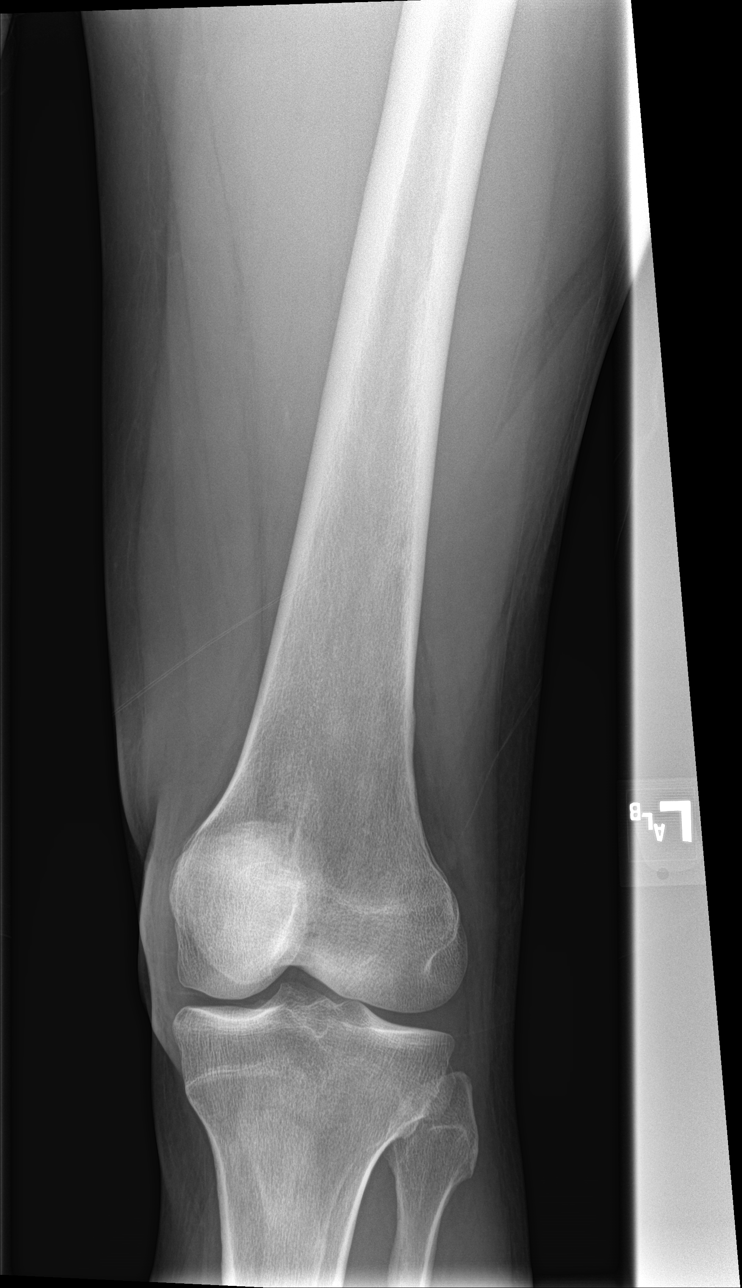

[femur lat (1 of 2)]
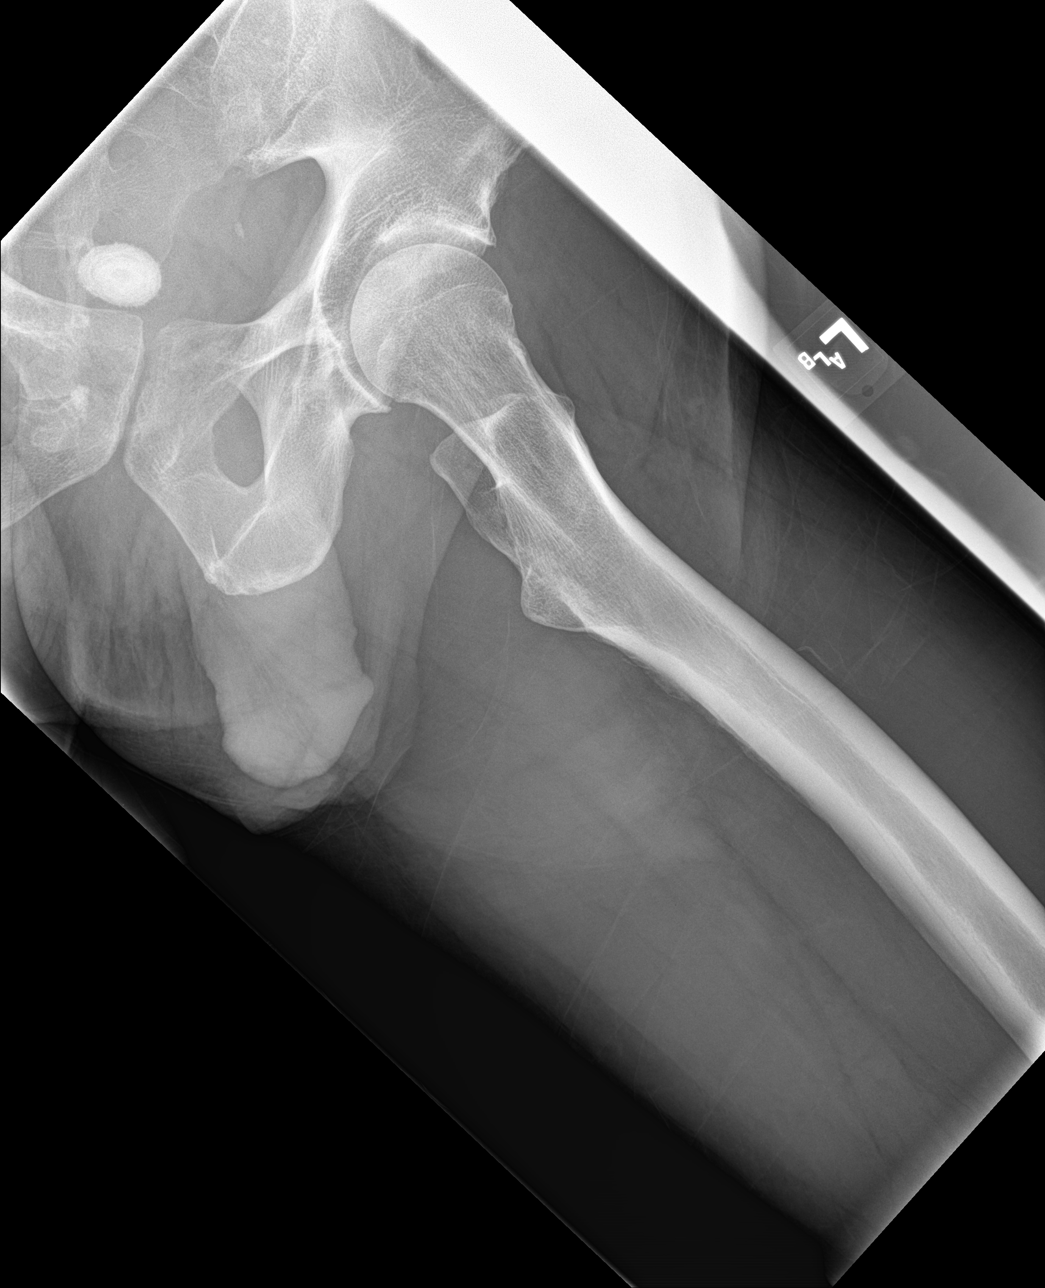

[femur lat (2 of 2)]
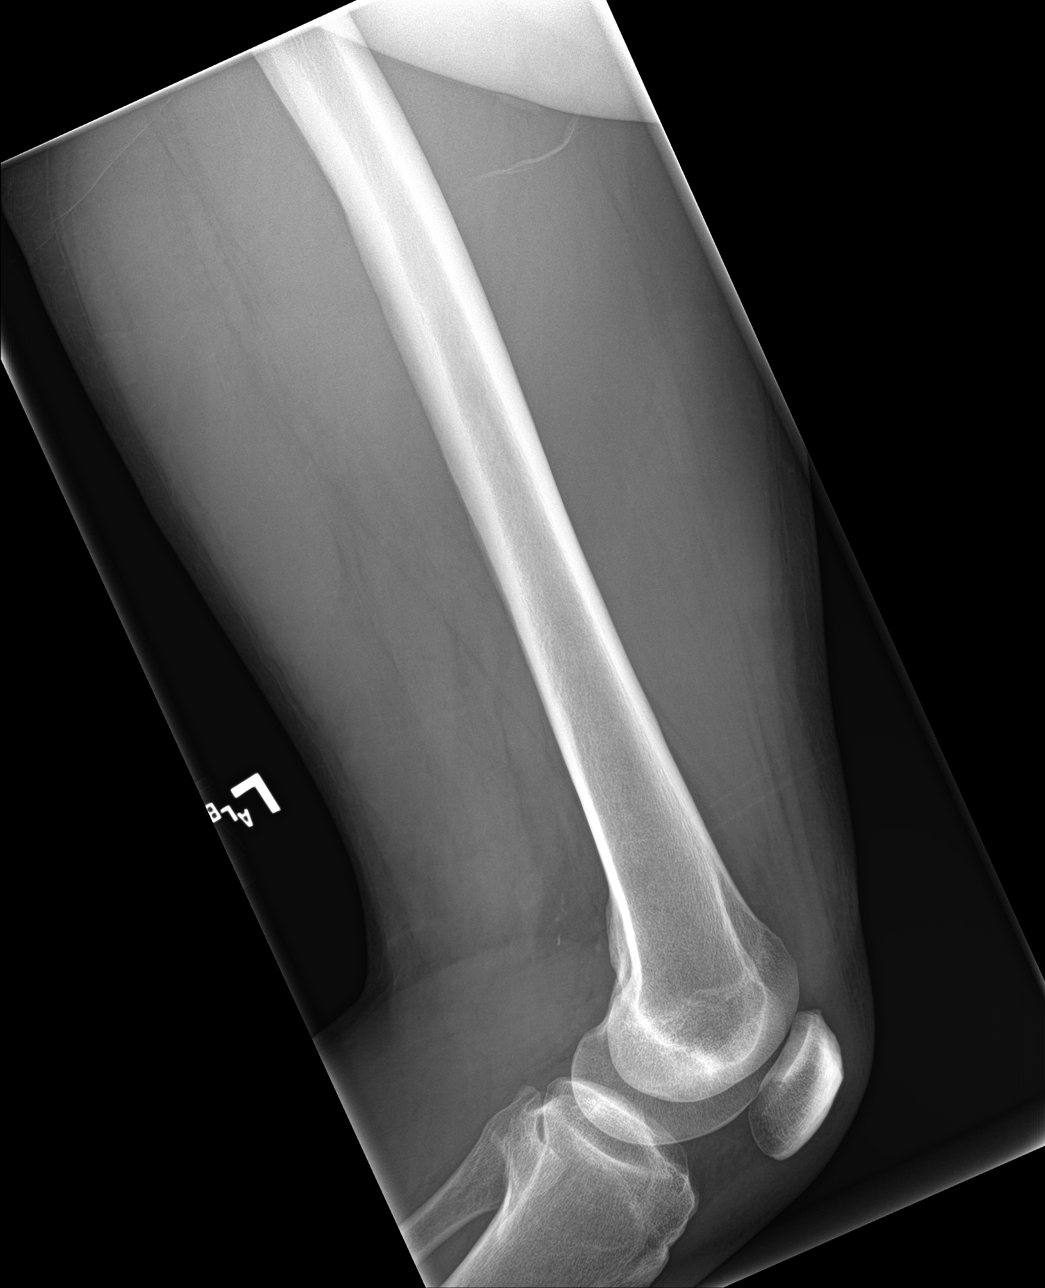

[4 of 4 positions shown; findings below may reference images not displayed]

FINDINGS: There is no acute fracture or dislocation. The bones are well
mineralized. No significant arthritic changes. No joint effusion.
There is a 2.5 cm ovoid calcific density over the pelvis which is
not characterized but may represent a bladder stone. Bladder
ultrasound may provide better evaluation. The soft tissues are
unremarkable.
IMPRESSION: 1. No acute fracture or dislocation.
2. Ovoid calcific density over the pelvis may represent a bladder
stone.

## 2021-04-22 IMAGING — CR DG ELBOW COMPLETE 3+V*L*
4 series · 5 of 5 positions shown · non-contrast
Comparison: None.

CLINICAL DATA: 53-year-old male with fall and trauma to the left
upper extremity.

EXAM:
LEFT SHOULDER - 2+ VIEW; LEFT ELBOW - COMPLETE 3+ VIEW

[elbow ap]
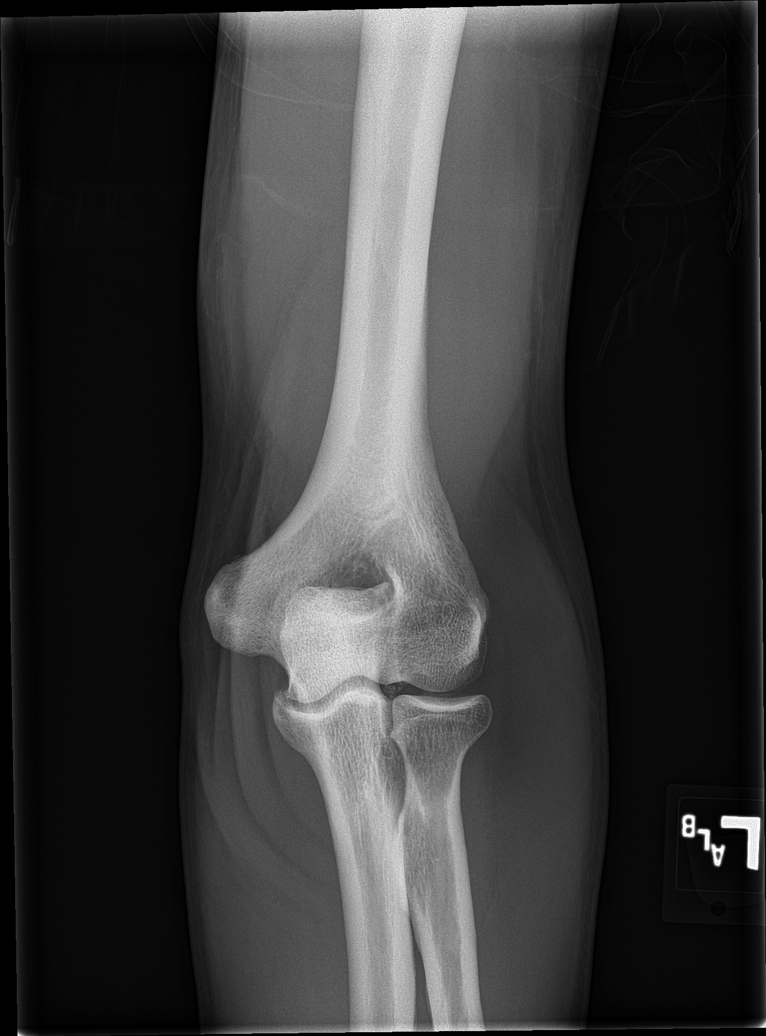

[elbow obl (1 of 2)]
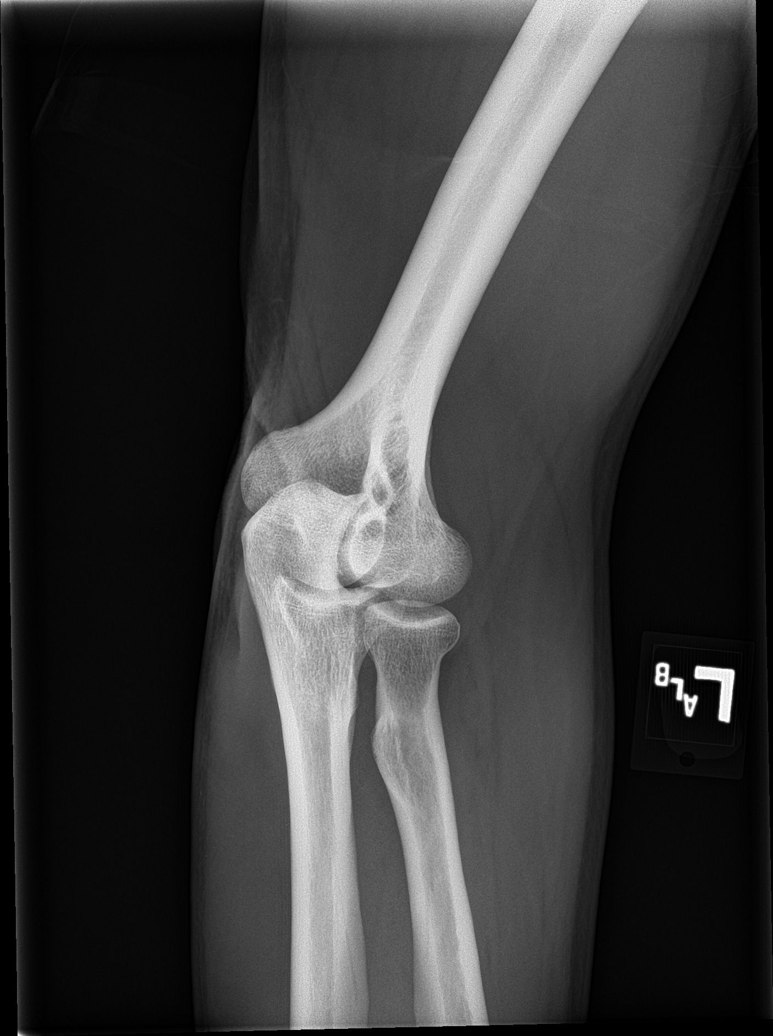

[elbow obl (2 of 2)]
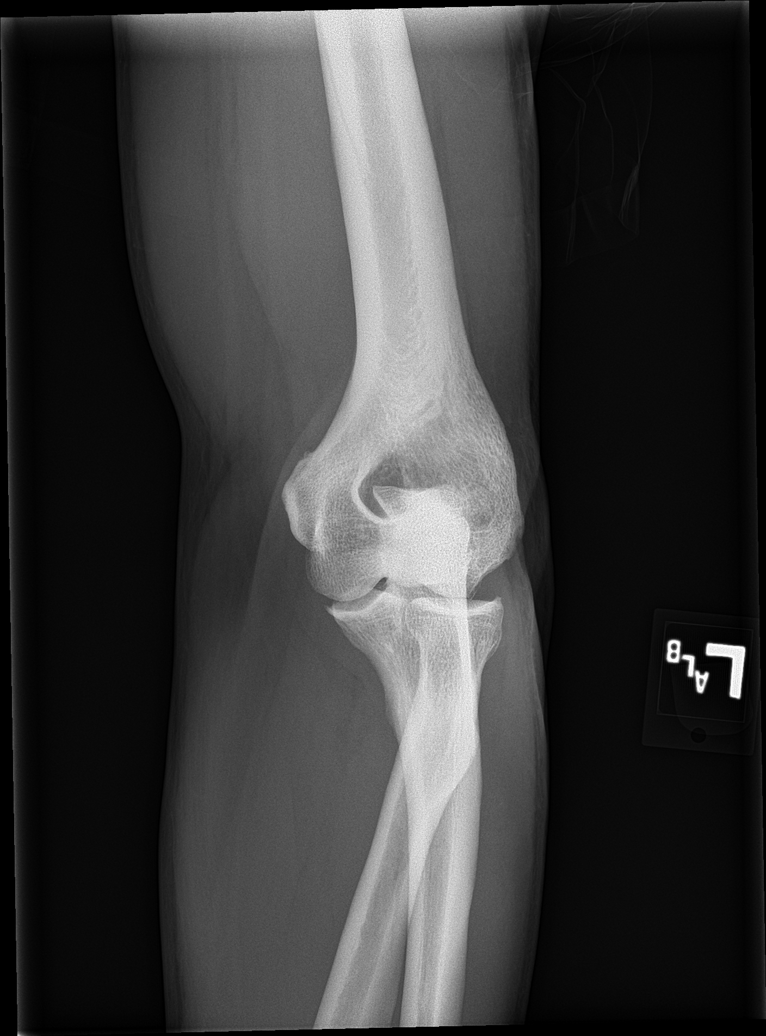

[Series 4: elbow lat · 0.14mm/px · 2 of 2 slices shown]
[im 1/2]
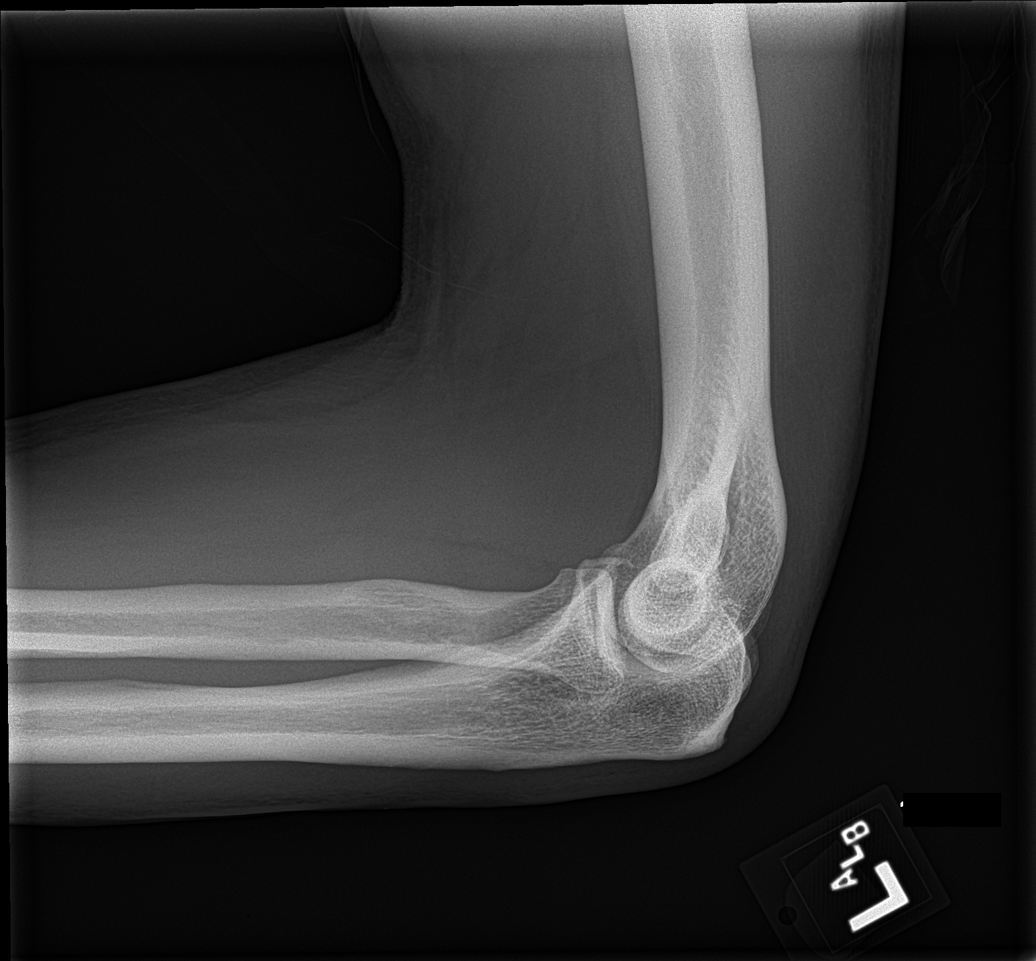
[im 2/2]
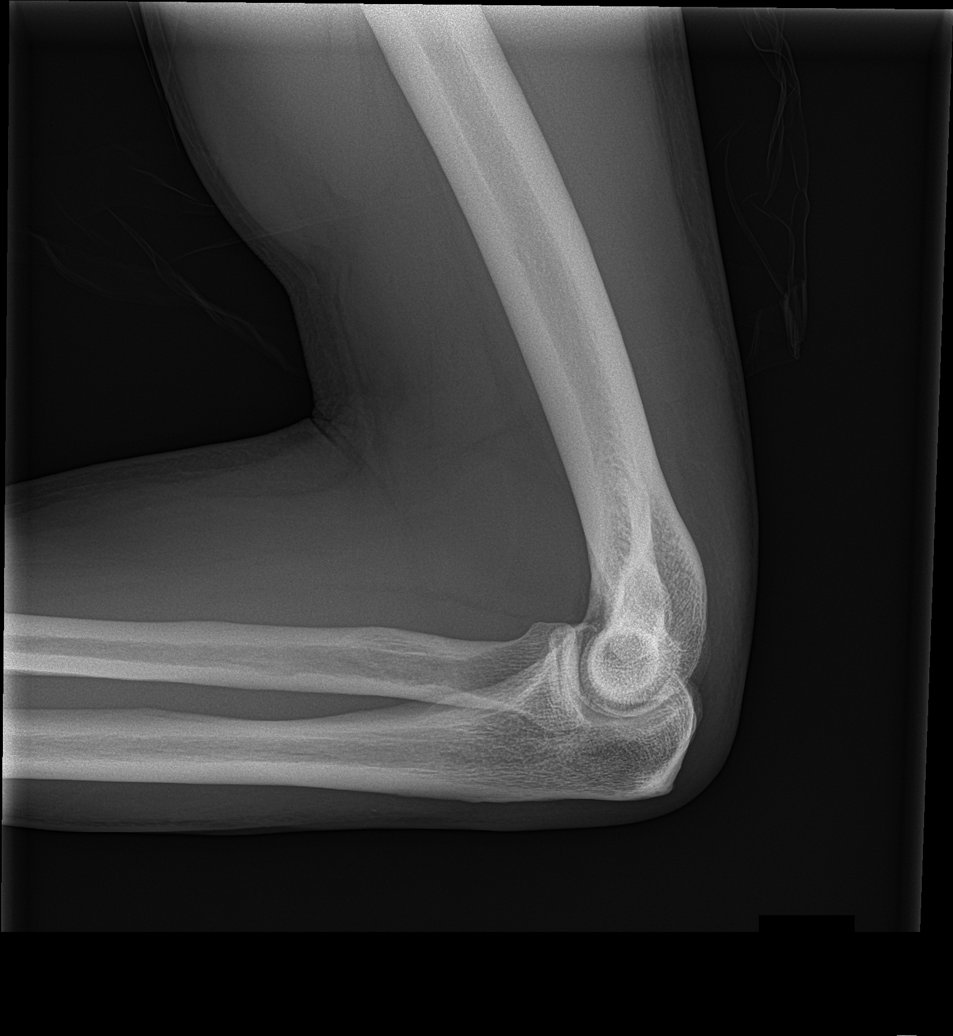

[5 of 5 positions shown; findings below may reference images not displayed]

FINDINGS: There is no acute fracture or dislocation. The bones are well
mineralized. No significant arthritic changes. No joint effusion.
The soft tissues are unremarkable.
IMPRESSION: Negative.

## 2021-05-13 ENCOUNTER — Encounter: Payer: Self-pay | Admitting: Internal Medicine

## 2021-05-13 ENCOUNTER — Ambulatory Visit (AMBULATORY_SURGERY_CENTER): Payer: BC Managed Care – PPO

## 2021-05-13 ENCOUNTER — Other Ambulatory Visit: Payer: Self-pay

## 2021-05-13 VITALS — Ht 74.0 in | Wt 194.0 lb

## 2021-05-13 DIAGNOSIS — Z1211 Encounter for screening for malignant neoplasm of colon: Secondary | ICD-10-CM

## 2021-05-13 MED ORDER — NA SULFATE-K SULFATE-MG SULF 17.5-3.13-1.6 GM/177ML PO SOLN: 1.0000 | Freq: Once | ORAL | 0 refills | Status: AC

## 2021-05-13 NOTE — Progress Notes (Signed)
No egg or soy allergy known to patient  No issues known to pt with past sedation with any surgeries or procedures Patient denies ever being told they had issues or difficulty with intubation  No FH of Malignant Hyperthermia Pt is not on diet pills Pt is not on home 02  Pt is not on blood thinners  Pt denies issues with constipation  No A fib or A flutter Pt is fully vaccinated for Covid x 2; NO PA's for preps discussed with pt in PV today  Discussed with pt there will be an out-of-pocket cost for prep and that varies from $0 to 70 + dollars - pt verbalized understanding  Due to the COVID-19 pandemic we are asking patients to follow certain guidelines in PV and the [place]   Pt aware of COVID protocols and [facility] guidelines  PV completed over the phone. Pt verified name, DOB, address and insurance during PV today.  Pt mailed instruction packet with copy of consent form to read and not return, and instructions.  Pt encouraged to call with questions or issues.  If pt has My chart, procedure instructions sent via My Chart

## 2021-05-26 ENCOUNTER — Encounter: Payer: Self-pay | Admitting: Certified Registered Nurse Anesthetist

## 2021-05-27 ENCOUNTER — Ambulatory Visit (AMBULATORY_SURGERY_CENTER): Payer: BC Managed Care – PPO | Admitting: Internal Medicine

## 2021-05-27 ENCOUNTER — Encounter: Payer: Self-pay | Admitting: Internal Medicine

## 2021-05-27 VITALS — BP 128/75 | HR 64 | Temp 98.4°F | Resp 10 | Ht 74.0 in | Wt 194.0 lb

## 2021-05-27 DIAGNOSIS — D120 Benign neoplasm of cecum: Secondary | ICD-10-CM | POA: Diagnosis not present

## 2021-05-27 DIAGNOSIS — Z1211 Encounter for screening for malignant neoplasm of colon: Secondary | ICD-10-CM

## 2021-05-27 DIAGNOSIS — D123 Benign neoplasm of transverse colon: Secondary | ICD-10-CM

## 2021-05-27 MED ORDER — SODIUM CHLORIDE 0.9 % IV SOLN: 500.0000 mL | Freq: Once | INTRAVENOUS | Status: DC

## 2021-05-27 NOTE — Patient Instructions (Signed)

## 2021-05-27 NOTE — Progress Notes (Signed)
Called to room to assist during endoscopic procedure.  Patient ID and intended procedure confirmed with present staff. Received instructions for my participation in the procedure from the performing physician.  

## 2021-05-27 NOTE — Op Note (Signed)
[facility] Patient Name: Timothy Cook Procedure Date: 05/27/2021 10:17 AM MRN: [id number] Endoscopist: Jerene Bears , MD Age: 56 Referring MD:  Date of Birth: 03/19/1966 Gender: Male Account #: [id number] Procedure:                Colonoscopy Indications:              Screening for colorectal malignant neoplasm, This                            is the patient's first colonoscopy Medicines:                Monitored Anesthesia Care Procedure:                Pre-Anesthesia Assessment:                           - Prior to the procedure, a History and Physical                            was performed, and patient medications and                            allergies were reviewed. The patient's tolerance of                            previous anesthesia was also reviewed. The risks                            and benefits of the procedure and the sedation                            options and risks were discussed with the patient.                            All questions were answered, and informed consent                            was obtained. Prior Anticoagulants: The patient has                            taken no previous anticoagulant or antiplatelet                            agents. ASA Grade Assessment: II - A patient with                            mild systemic disease. After reviewing the risks                            and benefits, the patient was deemed in                            satisfactory condition to undergo the procedure.  After obtaining informed consent, the colonoscope                            was passed under direct vision. Throughout the                            procedure, the patient's blood pressure, pulse, and                            oxygen saturations were monitored continuously. The                            CF [id number] #[id number] was introduced through the anus                            and advanced to the cecum,  identified by                            appendiceal orifice and ileocecal valve. The                            colonoscopy was performed without difficulty. The                            patient tolerated the procedure well. The quality                            of the bowel preparation was good. The ileocecal                            valve, appendiceal orifice, and rectum were                            photographed. Scope In: 10:45:55 AM Scope Out: 02:54:27 AM Scope Withdrawal Time: 0 hours 15 minutes 7 seconds  Total Procedure Duration: 0 hours 20 minutes 49 seconds  Findings:                 The digital rectal exam was normal.                           A 4 mm polyp was found in the cecum. The polyp was                            sessile. The polyp was removed with a cold snare.                            Resection and retrieval were complete.                           Two sessile polyps were found in the transverse                            colon. The polyps were 6 to 8 mm in  size. These                            polyps were removed with a cold snare. Resection                            and retrieval were complete.                           Multiple small-mouthed diverticula were found in                            the sigmoid colon.                           Internal hemorrhoids were found during                            retroflexion. The hemorrhoids were small. Complications:            No immediate complications. Estimated Blood Loss:     Estimated blood loss was minimal. Impression:               - One 4 mm polyp in the cecum, removed with a cold                            snare. Resected and retrieved.                           - Two 6 to 8 mm polyps in the transverse colon,                            removed with a cold snare. Resected and retrieved.                           - Diverticulosis in the sigmoid colon.                           - Small internal  hemorrhoids. Recommendation:           - Patient has a contact number available for                            emergencies. The signs and symptoms of potential                            delayed complications were discussed with the                            patient. Return to normal activities tomorrow.                            Written discharge instructions were provided to the                            patient.                           -  Resume previous diet.                           - Continue present medications.                           - Await pathology results.                           - Repeat colonoscopy is recommended. The                            colonoscopy date will be determined after pathology                            results from today's exam become available for                            review. Jerene Bears, MD 05/27/2021 11:12:01 AM This report has been signed electronically.

## 2021-05-27 NOTE — Progress Notes (Signed)
VS- Timothy Cook  Pt's states no medical or surgical changes since previsit or office visit.  

## 2021-05-27 NOTE — Progress Notes (Signed)
Report given to PACU, vss 

## 2021-05-27 NOTE — Progress Notes (Signed)
GASTROENTEROLOGY PROCEDURE H&P NOTE   Primary Care Physician: Kathyrn Drown, MD    Reason for Procedure:  Colon cancer screening  Plan:    Colonoscopy  Patient is appropriate for endoscopic procedure(s) in the ambulatory ([facility]) setting.  The nature of the procedure, as well as the risks, benefits, and alternatives were carefully and thoroughly reviewed with the patient. Ample time for discussion and questions allowed. The patient understood, was satisfied, and agreed to proceed.     HPI: Timothy Cook is a 56 y.o. male who presents for initial screening colonoscopy.  Medical history as below.  Tolerated the prep.  No recent chest pain or shortness of breath.  No abdominal pain.  No known family history of colorectal cancer.  Past Medical History:  Diagnosis Date   BPH (benign prostatic hyperplasia)     Past Surgical History:  Procedure Laterality Date   HYDROCELE EXCISION / REPAIR  1993   TONSILLECTOMY     UMBILICAL HERNIA REPAIR  2009   WISDOM TOOTH EXTRACTION      Prior to Admission medications   Medication Sig Start Date End Date Taking? Authorizing Provider  sildenafil (VIAGRA) 100 MG tablet Take 0.5-1 tablets (50-100 mg total) by mouth daily as needed for erectile dysfunction. 03/02/20   Kathyrn Drown, MD  tadalafil (CIALIS) 5 MG tablet 1 qd for BPH 04/20/20   Kathyrn Drown, MD    Current Outpatient Medications  Medication Sig Dispense Refill   sildenafil (VIAGRA) 100 MG tablet Take 0.5-1 tablets (50-100 mg total) by mouth daily as needed for erectile dysfunction. 5 tablet 11   tadalafil (CIALIS) 5 MG tablet 1 qd for BPH 30 tablet 5   Current Facility-Administered Medications  Medication Dose Route Frequency Provider Last Rate Last Admin   0.9 %  sodium chloride infusion  500 mL Intravenous Once Zymeir Salminen, Lajuan Lines, MD        Allergies as of 05/27/2021   (No Known Allergies)    Family History  Problem Relation Age of Onset   Liver cancer Mother 67   Heart  attack Mother 65   Lung cancer Mother 89       mets from liver   Bone cancer Mother 51       mets from liver   Prostate cancer Father 45   Healthy Brother    Healthy Brother    Prostate cancer Paternal Uncle    Prostate cancer Paternal Uncle    Prostate cancer Paternal Uncle    Prostate cancer Paternal Uncle    Throat cancer Paternal Uncle    Colon polyps Neg Hx    Colon cancer Neg Hx    Esophageal cancer Neg Hx    Stomach cancer Neg Hx    Rectal cancer Neg Hx     Social History   Socioeconomic History   Marital status: Married    Spouse name: Not on file   Number of children: Not on file   Years of education: Not on file   Highest education level: Not on file  Occupational History   Not on file  Tobacco Use   Smoking status: Every Day    Packs/day: 0.50    Types: Cigarettes   Smokeless tobacco: Never  Vaping Use   Vaping Use: Never used  Substance and Sexual Activity   Alcohol use: Not Currently    Comment: 2 times per year-special occasions   Drug use: Not Currently   Sexual activity: Not on file  Other  Topics Concern   Not on file  Social History Narrative   Not on file   Social Determinants of Health   Financial Resource Strain: Not on file  Food Insecurity: Not on file  Transportation Needs: Not on file  Physical Activity: Not on file  Stress: Not on file  Social Connections: Not on file  Intimate Partner Violence: Not on file    Physical Exam: Vital signs in last 24 hours: @BP  130/77    Pulse 60    Temp 98.4 F (36.9 C)    Resp 15    Ht 6\' 2"  (1.88 m)    Wt 194 lb (88 kg)    SpO2 96%    BMI 24.91 kg/m  GEN: NAD EYE: Sclerae anicteric ENT: MMM CV: Non-tachycardic Pulm: CTA b/l GI: Soft, NT/ND NEURO:  Alert & Oriented x 3   Zenovia Jarred, MD [facility]  05/27/2021 10:41 AM

## 2021-05-31 ENCOUNTER — Telehealth: Payer: Self-pay

## 2021-05-31 NOTE — Telephone Encounter (Signed)
°  Follow up Call-  Call back number 05/27/2021  Post procedure Call Back phone  # [phone]  Permission to leave phone message Yes     Patient questions:  Do you have a fever, pain , or abdominal swelling? No. Pain Score  0 *  Have you tolerated food without any problems? Yes.    Have you been able to return to your normal activities? Yes.    Do you have any questions about your discharge instructions: Diet   No. Medications  No. Follow up visit  No.  Do you have questions or concerns about your Care? No.  Actions: * If pain score is 4 or above: No action needed, pain <4.  Have you developed a fever since your procedure? no  2.   Have you had an respiratory symptoms (SOB or cough) since your procedure? no  3.   Have you tested positive for COVID 19 since your procedure no  4.   Have you had any family members/close contacts diagnosed with the COVID 19 since your procedure?  no   If yes to any of these questions please route to Joylene John, RN and Joella Prince, RN

## 2021-06-05 ENCOUNTER — Encounter: Payer: Self-pay | Admitting: Internal Medicine

## 2022-08-04 ENCOUNTER — Ambulatory Visit: Payer: BC Managed Care – PPO | Admitting: Family Medicine

## 2022-08-09 ENCOUNTER — Encounter: Payer: Self-pay | Admitting: Family Medicine

## 2022-08-09 ENCOUNTER — Ambulatory Visit (INDEPENDENT_AMBULATORY_CARE_PROVIDER_SITE_OTHER): Payer: BC Managed Care – PPO | Admitting: Family Medicine

## 2022-08-09 VITALS — BP 123/69 | HR 65 | Ht 72.25 in | Wt 210.8 lb

## 2022-08-09 DIAGNOSIS — Z720 Tobacco use: Secondary | ICD-10-CM

## 2022-08-09 DIAGNOSIS — N401 Enlarged prostate with lower urinary tract symptoms: Secondary | ICD-10-CM | POA: Diagnosis not present

## 2022-08-09 DIAGNOSIS — R3912 Poor urinary stream: Secondary | ICD-10-CM

## 2022-08-09 DIAGNOSIS — R350 Frequency of micturition: Secondary | ICD-10-CM

## 2022-08-09 DIAGNOSIS — N529 Male erectile dysfunction, unspecified: Secondary | ICD-10-CM

## 2022-08-09 DIAGNOSIS — N442 Benign cyst of testis: Secondary | ICD-10-CM

## 2022-08-09 DIAGNOSIS — Z125 Encounter for screening for malignant neoplasm of prostate: Secondary | ICD-10-CM

## 2022-08-09 DIAGNOSIS — Z0000 Encounter for general adult medical examination without abnormal findings: Secondary | ICD-10-CM

## 2022-08-09 DIAGNOSIS — E785 Hyperlipidemia, unspecified: Secondary | ICD-10-CM

## 2022-08-09 DIAGNOSIS — Z0001 Encounter for general adult medical examination with abnormal findings: Secondary | ICD-10-CM

## 2022-08-09 MED ORDER — SILDENAFIL CITRATE 100 MG PO TABS: 50.0000 mg | ORAL_TABLET | Freq: Every day | ORAL | 11 refills | Status: DC | PRN

## 2022-08-09 MED ORDER — TADALAFIL 5 MG PO TABS: ORAL_TABLET | ORAL | 12 refills | Status: DC

## 2022-08-09 NOTE — Progress Notes (Signed)
The patient comes in today for a wellness visit.

## 2022-08-09 NOTE — Progress Notes (Signed)
   Subjective:    Patient ID: Timothy Cook, male    DOB: August 04, 1965, 57 y.o.   MRN: [id number]  HPI The patient comes in today for a wellness visit.       A review of their health history was completed.  A review of medications was also completed.   Any needed refills; denies any new medicines but does need refills   Eating habits: Tries eat healthy for the most part   Falls/  MVA accidents in past few months: Denies any falls injuries denies depression   Regular exercise: Stays physically active multiple days per week   Specialist pt sees on regular basis: None currently   Preventative health issues were discussed.    Additional concerns: He is interested in seeing urology regarding the cyst on his privates     Review of Systems     Objective:   Physical Exam The 10-year ASCVD risk score (Arnett DK, et al., 2019) is: 15.6%   Values used to calculate the score:     Age: 57 years     Sex: Male     Is Non-Hispanic African American: No     Diabetic: No     Tobacco smoker: Yes     Systolic Blood Pressure: 123 mmHg     Is BP treated: No     HDL Cholesterol: 44 mg/dL     Total Cholesterol: 248 mg/dL  General-in no acute distress Eyes-no discharge Lungs-respiratory rate normal, CTA CV-no murmurs,RRR Extremities skin warm dry no edema Neuro grossly normal Behavior normal, alert Prostate mildly enlarged but no tenderness      Assessment & Plan:   1. Well adult exam Adult wellness-complete.wellness physical was conducted today. Importance of diet and exercise were discussed in detail.  Importance of stress reduction and healthy living were discussed.  In addition to this a discussion regarding safety was also covered.  We also reviewed over immunizations and gave recommendations regarding current immunization needed for age.   In addition to this additional areas were also touched on including: Preventative health exams needed:  Colonoscopy up-to-date on  colonoscopy  Patient was advised yearly wellness exam   2. Benign prostatic hyperplasia with urinary frequency He states he utilizes Cialis there every single day but it does not help him with erectile dysfunction therefore he states he also uses Viagra I did caution him regarding drowsiness and also if he had any erect erections greater than 4 hours but he states he tolerates this combination well  3. Hyperlipidemia, unspecified hyperlipidemia type Healthy diet check lab work may need to be on statin recommend cardiac calcium score - CT CARDIAC SCORING (SELF PAY ONLY) - Lipid panel - Basic Metabolic Panel (7) - Hepatic Function Panel - CBC with Differential  4. Erectile dysfunction, unspecified erectile dysfunction type Viagra as discussed above  5. Benign prostatic hyperplasia with weak urinary stream Cialis as discussed above  6. Tobacco abuse Chantix prescription was written side effects discussed if depression or suicidal ideation immediately stop medicine notify us if any problems  7. Screening PSA (prostate specific antigen) Screening PSA - PSA  8. 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

## 2022-08-18 DIAGNOSIS — Z125 Encounter for screening for malignant neoplasm of prostate: Secondary | ICD-10-CM | POA: Diagnosis not present

## 2022-08-18 DIAGNOSIS — E785 Hyperlipidemia, unspecified: Secondary | ICD-10-CM | POA: Diagnosis not present

## 2022-08-19 LAB — BASIC METABOLIC PANEL (7)
BUN/Creatinine Ratio: 14 (ref 9–20)
BUN: 14 mg/dL (ref 6–24)
CO2: 22 mmol/L (ref 20–29)
Chloride: 102 mmol/L (ref 96–106)
Creatinine, Ser: 0.99 mg/dL (ref 0.76–1.27)
Glucose: 87 mg/dL (ref 70–99)
Potassium: 4.1 mmol/L (ref 3.5–5.2)
Sodium: 140 mmol/L (ref 134–144)
eGFR: 88 mL/min/{1.73_m2} (ref >59)

## 2022-08-19 LAB — LIPID PANEL
Chol/HDL Ratio: 6.6 ratio — ABNORMAL HIGH (ref 0.0–5.0)
Cholesterol, Total: 250 mg/dL — ABNORMAL HIGH (ref 100–199)
HDL: 38 mg/dL — ABNORMAL LOW (ref >39)
LDL Chol Calc (NIH): 181 mg/dL — ABNORMAL HIGH (ref 0–99)
Triglycerides: 165 mg/dL — ABNORMAL HIGH (ref 0–149)
VLDL Cholesterol Cal: 31 mg/dL (ref 5–40)

## 2022-08-19 LAB — CBC WITH DIFFERENTIAL/PLATELET
Basophils Absolute: 0.1 10*3/uL (ref 0.0–0.2)
Basos: 1 %
EOS (ABSOLUTE): 0.4 10*3/uL (ref 0.0–0.4)
Eos: 3 %
Hematocrit: 51.3 % — ABNORMAL HIGH (ref 37.5–51.0)
Hemoglobin: 17.5 g/dL (ref 13.0–17.7)
Immature Grans (Abs): 0.4 10*3/uL — ABNORMAL HIGH (ref 0.0–0.1)
Immature Granulocytes: 3 %
Lymphocytes Absolute: 2.3 10*3/uL (ref 0.7–3.1)
Lymphs: 18 %
MCH: 33 pg (ref 26.6–33.0)
MCHC: 34.1 g/dL (ref 31.5–35.7)
MCV: 97 fL (ref 79–97)
Monocytes Absolute: 1.1 10*3/uL — ABNORMAL HIGH (ref 0.1–0.9)
Monocytes: 8 %
Neutrophils Absolute: 9.1 10*3/uL — ABNORMAL HIGH (ref 1.4–7.0)
Neutrophils: 67 %
RBC: 5.31 x10E6/uL (ref 4.14–5.80)
RDW: 12.4 % (ref 11.6–15.4)
WBC: 13.4 10*3/uL — ABNORMAL HIGH (ref 3.4–10.8)

## 2022-08-19 LAB — HEPATIC FUNCTION PANEL
ALT: 12 IU/L (ref 0–44)
AST: 15 IU/L (ref 0–40)
Albumin: 4.2 g/dL (ref 3.8–4.9)
Alkaline Phosphatase: 65 IU/L (ref 44–121)
Bilirubin Total: 0.2 mg/dL (ref 0.0–1.2)
Bilirubin, Direct: <0.1 mg/dL (ref 0.00–0.40)
Total Protein: 6.6 g/dL (ref 6.0–8.5)

## 2022-08-19 LAB — PSA: Prostate Specific Ag, Serum: 0.5 ng/mL (ref 0.0–4.0)

## 2022-08-26 ENCOUNTER — Ambulatory Visit: Payer: BC Managed Care – PPO | Admitting: Family Medicine

## 2022-09-02 ENCOUNTER — Telehealth: Payer: Self-pay

## 2022-09-02 NOTE — Telephone Encounter (Signed)
Called patient left message to call office on Monday.

## 2022-10-06 ENCOUNTER — Other Ambulatory Visit (HOSPITAL_COMMUNITY): Payer: BC Managed Care – PPO

## 2022-10-07 ENCOUNTER — Ambulatory Visit (HOSPITAL_COMMUNITY)
Admission: RE | Admit: 2022-10-07 | Discharge: 2022-10-07 | Disposition: A | Discharge status: Home / Self Care | Payer: BC Managed Care – PPO | Source: Ambulatory Visit | Attending: Family Medicine | Admitting: Family Medicine

## 2022-10-07 DIAGNOSIS — E785 Hyperlipidemia, unspecified: Secondary | ICD-10-CM | POA: Insufficient documentation

## 2022-10-08 ENCOUNTER — Encounter: Payer: Self-pay | Admitting: Family Medicine

## 2022-10-08 NOTE — Progress Notes (Signed)
Please mail to patient

## 2022-10-14 NOTE — Progress Notes (Signed)
Called and left message to call office

## 2022-10-15 ENCOUNTER — Telehealth: Payer: Self-pay | Admitting: Family Medicine

## 2022-10-15 NOTE — Telephone Encounter (Signed)
Front-please connect with patient set him up for on office visit or virtual visit to discuss elevated coronary calcium and discuss what we need to do about it I did send him a MyChart message regarding this thank you Either an appointment this week or an appointment after I finished my time off thank you

## 2022-10-31 ENCOUNTER — Encounter: Payer: Self-pay | Admitting: Urology

## 2022-10-31 ENCOUNTER — Ambulatory Visit (INDEPENDENT_AMBULATORY_CARE_PROVIDER_SITE_OTHER): Payer: BC Managed Care – PPO | Admitting: Urology

## 2022-10-31 VITALS — BP 143/78 | HR 67

## 2022-10-31 DIAGNOSIS — N5201 Erectile dysfunction due to arterial insufficiency: Secondary | ICD-10-CM

## 2022-10-31 DIAGNOSIS — N401 Enlarged prostate with lower urinary tract symptoms: Secondary | ICD-10-CM

## 2022-10-31 DIAGNOSIS — N138 Other obstructive and reflux uropathy: Secondary | ICD-10-CM

## 2022-10-31 DIAGNOSIS — R350 Frequency of micturition: Secondary | ICD-10-CM

## 2022-10-31 DIAGNOSIS — N4340 Spermatocele of epididymis, unspecified: Secondary | ICD-10-CM

## 2022-10-31 LAB — URINALYSIS, ROUTINE W REFLEX MICROSCOPIC
Bilirubin, UA: NEGATIVE
Glucose, UA: NEGATIVE
Ketones, UA: NEGATIVE
Leukocytes,UA: NEGATIVE
Nitrite, UA: NEGATIVE
Protein,UA: NEGATIVE
RBC, UA: NEGATIVE
Specific Gravity, UA: 1.015 (ref 1.005–1.030)
Urobilinogen, Ur: 0.2 mg/dL (ref 0.2–1.0)
pH, UA: 7.5 (ref 5.0–7.5)

## 2022-10-31 NOTE — Progress Notes (Addendum)
10/31/2022 3:11 PM   Timothy Cook November 16, 1965 [id number]  Referring provider: Babs Sciara, MD [address] [place],  [place] [address]  No chief complaint on file.   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 Gerda Diss but pt saw GU in [place] / [facility] Oct 2020 but [place] reports left varicocele and right cyst. He hasn't noticed much of a change. He had a left hydrocele repaired in his 30s. Exam today -Jul 2024- shows a LEFT spermatocele. No varicocele on either side. He recalls a low T. He never had kids. His wife had fibroids. Ovarian cysts. No scrotal surgery.   3) BPH - His PSA was 0.5 in Apr 2024. Take tadalafil for bph, frequency.  4) ED - takes sildenafil for ED. He noted decreased libido and decreased ejaculate. Weight gain.   Today, seen for the above.   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   PMH: Past Medical History:  Diagnosis Date   BPH (benign prostatic hyperplasia)     Surgical History: Past Surgical History:  Procedure Laterality Date   HYDROCELE EXCISION / REPAIR  1993   TONSILLECTOMY     UMBILICAL HERNIA REPAIR  2009   WISDOM TOOTH EXTRACTION      Home Medications:  Allergies as of 10/31/2022   No Known Allergies      Medication List        Accurate as of October 31, 2022  3:11 PM. If you have any questions, ask your nurse or doctor.          sildenafil 100 MG tablet Commonly known as: Viagra Take 0.5-1 tablets (50-100 mg total) by mouth daily as needed for erectile dysfunction.   tadalafil 5 MG tablet Commonly known as: Cialis 1 qd for BPH   terbinafine 250 MG tablet Commonly known as: LAMISIL Take 250 mg by mouth daily.        Allergies: No Known Allergies  Family History: Family History  Problem Relation Age of Onset   Liver cancer Mother 1   Heart attack Mother 6   Lung cancer Mother 35       mets from liver   Bone cancer Mother 79       mets from  liver   Prostate cancer Father 81   Healthy Brother    Healthy Brother    Prostate cancer Paternal Uncle    Prostate cancer Paternal Uncle    Prostate cancer Paternal Uncle    Prostate cancer Paternal Uncle    Throat cancer Paternal Uncle    Colon polyps Neg Hx    Colon cancer Neg Hx    Esophageal cancer Neg Hx    Stomach cancer Neg Hx    Rectal cancer Neg Hx     Social History:  reports that he has been smoking cigarettes. He has been smoking an average of .5 packs per day. He has never used smokeless tobacco. He reports that he does not currently use alcohol. He reports that he does not currently use drugs.   Physical Exam: BP (!) 143/78   Pulse 67   Constitutional:  Alert and oriented, No acute distress. HEENT: [place] AT, moist mucus membranes.  Trachea midline, no masses. Cardiovascular: No clubbing, cyanosis, or edema. Respiratory: Normal respiratory effort, no increased work of breathing. GI: Abdomen is soft, nontender, nondistended, no abdominal masses GU: No CVA tenderness Lymph:  No cervical or inguinal lymphadenopathy. Skin: No rashes, bruises or suspicious lesions. Neurologic: Grossly intact, no focal deficits, moving all 4 extremities. Psychiatric: Normal mood and affect. GU: Penis circumcised, normal foreskin, testicles descended bilaterally and palpably normal, right epididymis palpably normal, left epididymal head with a 10 mm cystic structure, scrotum normal, no veins palpable.  DRE: declined - reports it was just done    Laboratory Data: Lab Results  Component Value Date   WBC 13.4 (H) 08/18/2022   HGB 17.5 08/18/2022   HCT 51.3 (H) 08/18/2022   MCV 97 08/18/2022   PLT CANCELED 08/18/2022    Lab Results  Component Value Date   CREATININE 0.99 08/18/2022    No results found for: "PSA"  No results found for: "TESTOSTERONE"  No results found for: "HGBA1C"  Urinalysis No results found for: "COLORURINE", "APPEARANCEUR", "LABSPEC", "PHURINE",  "GLUCOSEU", "HGBUR", "BILIRUBINUR", "KETONESUR", "PROTEINUR", "UROBILINOGEN", "NITRITE", "LEUKOCYTESUR"  No results found for: "LABMICR", "WBCUA", "RBCUA", "LABEPIT", "MUCUS", "BACTERIA"   Assessment & Plan:    1. Cyst of testis Will set up a repeat scrotal US sometime at [facility]. - Urinalysis, Routine w reflex microscopic  2. BPH   3. ED - discussed testosterone check and he declined for now. He wants to try and lose some weight on his own.  No follow-ups on file.  Jerilee Field, MD  [facility]  [address] [place], [place] [address] [phone]

## 2022-11-18 ENCOUNTER — Ambulatory Visit (HOSPITAL_COMMUNITY)
Admission: RE | Admit: 2022-11-18 | Discharge: 2022-11-18 | Disposition: A | Discharge status: Home / Self Care | Payer: BC Managed Care – PPO | Source: Ambulatory Visit | Attending: Urology | Admitting: Urology

## 2022-11-18 DIAGNOSIS — N433 Hydrocele, unspecified: Secondary | ICD-10-CM | POA: Diagnosis not present

## 2022-11-18 DIAGNOSIS — N4340 Spermatocele of epididymis, unspecified: Secondary | ICD-10-CM

## 2022-11-18 DIAGNOSIS — N503 Cyst of epididymis: Secondary | ICD-10-CM | POA: Diagnosis not present

## 2022-11-18 DIAGNOSIS — N5089 Other specified disorders of the male genital organs: Secondary | ICD-10-CM | POA: Diagnosis not present

## 2022-11-23 ENCOUNTER — Encounter: Payer: Self-pay | Admitting: Family Medicine

## 2022-11-23 ENCOUNTER — Telehealth: Payer: Self-pay | Admitting: Family Medicine

## 2022-11-23 NOTE — Telephone Encounter (Signed)
I dictated a new letter please send certified mail please document that it was sent certified mail thank you

## 2022-11-23 NOTE — Progress Notes (Signed)
Nurses-please mail this as a certified letter Please document it as being mailed

## 2022-12-05 ENCOUNTER — Telehealth: Payer: Self-pay

## 2022-12-05 NOTE — Progress Notes (Signed)
Let Timothy Cook know I didn't see anything worrisome on his scrotal US. His testicles appear normal. The mass on the left side of the scrotum we can feel doesn't seem to involve the testicle. We can do surgery to remove it.

## 2022-12-05 NOTE — Telephone Encounter (Signed)
-----   Message from Jerilee Field sent at 12/05/2022 12:07 PM EDT ----- Let Gregary Signs know I didn't see anything worrisome on his scrotal US. His testicles appear normal. The mass on the left side of the scrotum we can feel doesn't seem to involve the testicle. We can do surgery to remove it.

## 2022-12-05 NOTE — Telephone Encounter (Signed)
Open in error

## 2022-12-07 ENCOUNTER — Encounter: Payer: Self-pay | Admitting: Family Medicine

## 2022-12-07 ENCOUNTER — Ambulatory Visit (INDEPENDENT_AMBULATORY_CARE_PROVIDER_SITE_OTHER): Payer: BC Managed Care – PPO | Admitting: Family Medicine

## 2022-12-07 VITALS — BP 118/76 | HR 67 | Temp 97.9°F | Ht 72.25 in | Wt 226.0 lb

## 2022-12-07 DIAGNOSIS — R931 Abnormal findings on diagnostic imaging of heart and coronary circulation: Secondary | ICD-10-CM

## 2022-12-07 DIAGNOSIS — Z79899 Other long term (current) drug therapy: Secondary | ICD-10-CM | POA: Diagnosis not present

## 2022-12-07 DIAGNOSIS — E785 Hyperlipidemia, unspecified: Secondary | ICD-10-CM

## 2022-12-07 MED ORDER — ROSUVASTATIN CALCIUM 20 MG PO TABS
20.0000 mg | ORAL_TABLET | Freq: Every day | ORAL | 1 refills | Status: DC
Start: 2022-12-07 — End: 2023-02-07

## 2022-12-07 NOTE — Progress Notes (Signed)
   Subjective:    Patient ID: Timothy Cook, male    DOB: [birth year], 57 y.o.   MRN: [id number]  HPI  CT cardiac imaging follow up  Abnormal CT scan Comes in to discuss Denies chest pressure tightness shortness of breath States energy level pretty good Works a very strenuous vigorous job Quit smoking 4 months ago  Review of Systems     Objective:   Physical Exam  General-in no acute distress Eyes-no discharge Lungs-respiratory rate normal, CTA CV-no murmurs,RRR Extremities skin warm dry no edema Neuro grossly normal Behavior normal, alert  Patient quit smoking 4 months ago     Assessment & Plan:  1. Elevated coronary artery calcium score Lab ordered await results - ALT - Ambulatory referral to Cardiology - EKG 12-Lead  2. Hyperlipidemia, unspecified hyperlipidemia type Start Crestor follow-up again in 8 to 10 weeks - Lipid Panel Goal is to get LDL below 70 and if possible below 55 He will follow-up within the course the next 4 months  Cardiology consult necessary with high percentile findings on his coronary calcium in addition to this works a very strenuous job may well benefit from having stress test

## 2022-12-27 ENCOUNTER — Telehealth: Payer: Self-pay

## 2022-12-27 DIAGNOSIS — N210 Calculus in bladder: Secondary | ICD-10-CM

## 2022-12-27 NOTE — Telephone Encounter (Signed)
Patient/wife is made aware and voiced understanding OK. Please order KUB for urinary frequency, possible bladder stone - thank you

## 2022-12-27 NOTE — Addendum Note (Signed)
Addended by: Christoper Fabian R on: 12/27/2022 04:50 PM   Modules accepted: Orders

## 2022-12-27 NOTE — Telephone Encounter (Addendum)
Patient is made aware of Dr. Mena Goes response and voiced understanding. Patient's wife voiced that patient has bladder stone and wants to know if patient can have CT or x-ray done, patient has frequent urination. Patient's wife states that she was not at last visited and patient has had bladder stone for a while. Patient/wife is aware a message will be sent to provider. Voiced understanding

## 2022-12-27 NOTE — Telephone Encounter (Signed)
Tried calling patient with no answer, left vm  for return call "Timothy Cook know I didn't see anything worrisome on his scrotal US. His testicles appear normal. The mass on the left side of the scrotum we can feel doesn't seem to involve the testicle. We can do surgery to remove it."

## 2023-01-05 ENCOUNTER — Ambulatory Visit (HOSPITAL_COMMUNITY)
Admission: RE | Admit: 2023-01-05 | Discharge: 2023-01-05 | Disposition: A | Discharge status: Home / Self Care | Payer: BC Managed Care – PPO | Source: Ambulatory Visit | Attending: Urology | Admitting: Urology

## 2023-01-05 DIAGNOSIS — N210 Calculus in bladder: Secondary | ICD-10-CM | POA: Diagnosis not present

## 2023-01-05 DIAGNOSIS — R39198 Other difficulties with micturition: Secondary | ICD-10-CM | POA: Diagnosis not present

## 2023-01-12 ENCOUNTER — Encounter: Payer: Self-pay | Admitting: Internal Medicine

## 2023-01-30 ENCOUNTER — Ambulatory Visit: Payer: BC Managed Care – PPO | Admitting: Urology

## 2023-02-01 ENCOUNTER — Encounter: Payer: Self-pay | Admitting: Family Medicine

## 2023-02-02 DIAGNOSIS — R931 Abnormal findings on diagnostic imaging of heart and coronary circulation: Secondary | ICD-10-CM | POA: Diagnosis not present

## 2023-02-02 DIAGNOSIS — E785 Hyperlipidemia, unspecified: Secondary | ICD-10-CM | POA: Diagnosis not present

## 2023-02-03 LAB — LIPID PANEL
Chol/HDL Ratio: 3.9 {ratio} (ref 0.0–5.0)
Cholesterol, Total: 152 mg/dL (ref 100–199)
HDL: 39 mg/dL — ABNORMAL LOW (ref >39)
LDL Chol Calc (NIH): 89 mg/dL (ref 0–99)
Triglycerides: 137 mg/dL (ref 0–149)
VLDL Cholesterol Cal: 24 mg/dL (ref 5–40)

## 2023-02-03 LAB — ALT: ALT: 23 [IU]/L (ref 0–44)

## 2023-02-04 ENCOUNTER — Encounter: Payer: Self-pay | Admitting: Family Medicine

## 2023-02-04 NOTE — Progress Notes (Signed)
Please offer the patient to mail this or just send it through MyChart if he would prefer

## 2023-02-06 ENCOUNTER — Ambulatory Visit: Payer: BC Managed Care – PPO | Admitting: Urology

## 2023-02-07 ENCOUNTER — Other Ambulatory Visit: Payer: Self-pay

## 2023-02-07 DIAGNOSIS — E785 Hyperlipidemia, unspecified: Secondary | ICD-10-CM

## 2023-02-07 MED ORDER — ROSUVASTATIN CALCIUM 40 MG PO TABS
40.0000 mg | ORAL_TABLET | Freq: Every day | ORAL | 1 refills | Status: DC
Start: 2023-02-07 — End: 2023-08-17

## 2023-02-08 ENCOUNTER — Ambulatory Visit: Payer: BC Managed Care – PPO | Admitting: Family Medicine

## 2023-02-10 ENCOUNTER — Ambulatory Visit: Payer: BC Managed Care – PPO | Admitting: Nurse Practitioner

## 2023-02-10 VITALS — BP 128/78 | HR 75 | Temp 97.7°F | Ht 72.05 in | Wt 237.2 lb

## 2023-02-10 DIAGNOSIS — K122 Cellulitis and abscess of mouth: Secondary | ICD-10-CM | POA: Insufficient documentation

## 2023-02-10 DIAGNOSIS — K089 Disorder of teeth and supporting structures, unspecified: Secondary | ICD-10-CM | POA: Insufficient documentation

## 2023-02-10 MED ORDER — CYCLOBENZAPRINE HCL 10 MG PO TABS: ORAL_TABLET | ORAL | 0 refills | Status: DC

## 2023-02-10 MED ORDER — PENICILLIN V POTASSIUM 500 MG PO TABS: ORAL_TABLET | ORAL | 0 refills | Status: DC

## 2023-02-11 ENCOUNTER — Encounter: Payer: Self-pay | Admitting: Nurse Practitioner

## 2023-02-11 NOTE — Progress Notes (Signed)
   Subjective:    Patient ID: Timothy Cook, male    DOB: 1966-01-21, 57 y.o.   MRN: [id number]  HPI Presents for complaints of tooth and gum pain.  This is a chronic issue for the past 4 years.  Patient is currently working on getting dental implants but taking time because of his insurance and cost.  Denies any fever.  Slight sore throat yesterday but none today.  Having increased tightness around his neck and jaw which she describes as muscle spasms at nighttime, requesting a prescription for Flexeril to see if this will help.  No difficulty breathing or swallowing.  Was off his penicillin for about a week, symptoms were well-controlled but once he came off of it the infection came back.   Review of Systems  Constitutional:  Negative for fever.  HENT:  Positive for dental problem and sore throat. Negative for ear pain, sinus pressure and trouble swallowing.   Respiratory:  Negative for cough, chest tightness and shortness of breath.   Cardiovascular:  Negative for chest pain.       Objective:   Physical Exam NAD.  Alert, oriented.  TMs mild clear effusion, no erythema.  Pharynx minimally injected with clear PND noted.  Neck supple with mild soft anterior adenopathy.  Significant erosion and tooth loss as well as breakage noted all around the mouth with some mild inflammation.  Patient points to a tooth in the front which is very loose which is causing him the most discomfort.  Lungs clear.  Heart regular rate rhythm. Today's Vitals   02/10/23 0837  BP: 128/78  Pulse: 75  Temp: 97.7 F (36.5 C)  SpO2: 96%  Weight: 237 lb 3.2 oz (107.6 kg)  Height: 6' 0.05" (1.83 m)   Body mass index is 32.13 kg/m.        Assessment & Plan:   Problem List Items Addressed This Visit       Digestive   Oral infection - Primary   Poor dentition   Meds ordered this encounter  Medications   penicillin v potassium (VEETID) 500 MG tablet    Sig: Take one tab po QID prn mouth infection     Dispense:  120 tablet    Refill:  0    Order Specific Question:   Supervising Provider    Answer:   Lilyan Punt A [[id number]]   cyclobenzaprine (FLEXERIL) 10 MG tablet    Sig: Take one po at bedtime as needed for muscle spasms    Dispense:  30 tablet    Refill:  0    Order Specific Question:   Supervising Provider    Answer:   Lilyan Punt A [[id number]]   Restart Pen-Vee K as directed as needed mouth infection.  Our goal is to keep this under control until he can get his dental surgery performed.  Call back if further refills are needed. Cyclobenzaprine at bedtime as needed for muscle spasms. Warning signs reviewed.  Call back if any new or worsening symptoms.

## 2023-02-15 ENCOUNTER — Ambulatory Visit: Payer: BC Managed Care – PPO | Admitting: Internal Medicine

## 2023-02-20 ENCOUNTER — Ambulatory Visit: Payer: BC Managed Care – PPO | Attending: Internal Medicine | Admitting: Internal Medicine

## 2023-02-20 ENCOUNTER — Encounter: Payer: Self-pay | Admitting: Internal Medicine

## 2023-02-20 VITALS — BP 120/80 | HR 77 | Ht 74.0 in | Wt 228.0 lb

## 2023-02-20 DIAGNOSIS — R931 Abnormal findings on diagnostic imaging of heart and coronary circulation: Secondary | ICD-10-CM | POA: Diagnosis not present

## 2023-02-20 MED ORDER — ASPIRIN 81 MG PO TBEC: 81.0000 mg | DELAYED_RELEASE_TABLET | Freq: Every day | ORAL | 2 refills | Status: AC

## 2023-02-20 NOTE — Progress Notes (Signed)
Cardiology Office Note  Date: 02/20/2023   ID: Mosses Mcdill, DOB 1965/08/18, MRN [id number]  PCP:  Babs Sciara, MD  Cardiologist:  Marjo Bicker, MD Electrophysiologist:  None   History of Present Illness: Timothy Cook is a 57 y.o. male with no past medical history was referred to cardiology clinic for evaluation of elevated coronary artery calcium score.  CT calcium scoring contrast was performed in 6/24 that showed elevated color calcium score of 331 which was 89th percentile for age and sex matched control.  Calcium was more localized in the LAD and RCA vessels.  He was started on rosuvastatin 20 mg nightly by his PCP which was later increased to 40 mg nightly.  Goal LDL will be less than 70.  Not on aspirin.  METs more than 4, physically active at baseline, denies having any angina or DOE.  No orthopnea, PND, dizziness, presyncope, syncope, palpitations or leg swelling.  No family history of CAD.  No prior history of ischemia evaluation or CAD.  Past Medical History:  Diagnosis Date   BPH (benign prostatic hyperplasia)     Past Surgical History:  Procedure Laterality Date   HYDROCELE EXCISION / REPAIR  1993   TONSILLECTOMY     UMBILICAL HERNIA REPAIR  2009   WISDOM TOOTH EXTRACTION      Current Outpatient Medications  Medication Sig Dispense Refill   cyclobenzaprine (FLEXERIL) 10 MG tablet Take one po at bedtime as needed for muscle spasms 30 tablet 0   penicillin v potassium (VEETID) 500 MG tablet Take one tab po QID prn mouth infection 120 tablet 0   rosuvastatin (CRESTOR) 40 MG tablet Take 1 tablet (40 mg total) by mouth daily. 90 tablet 1   sildenafil (VIAGRA) 100 MG tablet Take 100 mg by mouth as needed for erectile dysfunction.     tadalafil (CIALIS) 5 MG tablet Take 5 mg by mouth daily. BPH     No current facility-administered medications for this visit.   Allergies:  Patient has no known allergies.   Social History: The patient  reports that he quit  smoking about 6 months ago. His smoking use included cigarettes. He has never used smokeless tobacco. He reports that he does not currently use alcohol. He reports that he does not currently use drugs.   Family History: The patient's family history includes Bone cancer (age of onset: 29) in his mother; Healthy in his brother and brother; Heart attack (age of onset: 88) in his mother; Liver cancer (age of onset: 75) in his mother; Lung cancer (age of onset: 94) in his mother; Prostate cancer in his paternal uncle, paternal uncle, paternal uncle, and paternal uncle; Prostate cancer (age of onset: 44) in his father; Throat cancer in his paternal uncle.   ROS:  Please see the history of present illness. Otherwise, complete review of systems is positive for none  All other systems are reviewed and negative.   Physical Exam: VS:  BP 120/80   Pulse 77   Ht 6\' 2"  (1.88 m)   Wt 228 lb (103.4 kg)   SpO2 93%   BMI 29.27 kg/m , BMI Body mass index is 29.27 kg/m.  Wt Readings from Last 3 Encounters:  02/20/23 228 lb (103.4 kg)  02/10/23 237 lb 3.2 oz (107.6 kg)  12/07/22 226 lb (102.5 kg)    General: Patient appears comfortable at rest. HEENT: Conjunctiva and lids normal, oropharynx clear with moist mucosa. Neck: Supple, no elevated JVP or carotid bruits,  no thyromegaly. Lungs: Clear to auscultation, nonlabored breathing at rest. Cardiac: Regular rate and rhythm, no S3 or significant systolic murmur, no pericardial rub. Abdomen: Soft, nontender, no hepatomegaly, bowel sounds present, no guarding or rebound. Extremities: No pitting edema, distal pulses 2+. Skin: Warm and dry. Musculoskeletal: No kyphosis. Neuropsychiatric: Alert and oriented x3, affect grossly appropriate.  Recent Labwork: 08/18/2022: AST 15; BUN 14; Creatinine, Ser 0.99; Hemoglobin 17.5; Platelets CANCELED; Potassium 4.1; Sodium 140 02/02/2023: ALT 23     Component Value Date/Time   CHOL 152 02/02/2023 0931   TRIG 137  02/02/2023 0931   HDL 39 (L) 02/02/2023 0931   CHOLHDL 3.9 02/02/2023 0931   LDLCALC 89 02/02/2023 0931     Assessment and Plan:  Elevated coronary calcium score 331 (89th percentile for age and sex matched control) HLD, not at goal   -CT calcium scoring of coronaries was performed in 10/2022 that showed elevated coronary calcium score of 331 which is 89% for age and sex matched control.  Calcium is localized in the LAD and RCA. Start aspirin to 1 mg once daily and continue rosuvastatin 40 mg nightly.  Goal LDL is 70.  Physically active at baseline, METs more than 4, denies having any angina or DOE.  No other symptoms, overall doing great.  Will defer stress testing at this time.  Will see him back in 1 year.   I have spent a total duration 30 minutes reviewing the prior imaging studies, EKG, labs, face-to-face discussion/counseling of his medical condition, pathophysiology, evaluation, management, answering all his questions, ordering medications and documenting the findings in the note.   Medication Adjustments/Labs and Tests Ordered: Current medicines are reviewed at length with the patient today.  Concerns regarding medicines are outlined above.    Disposition:  Follow up  1 year  Signed Meli Faley Verne Spurr, MD, 02/20/2023 3:49 PM    [facility] at [facility] [address] [place], [place], [place] [address]

## 2023-02-20 NOTE — Patient Instructions (Signed)
Medication Instructions:  Your physician has recommended you make the following change in your medication:  Start taking Aspirin 81 mg once daily Continue taking all other medications as prescribed  Labwork: None  Testing/Procedures: None  Follow-Up: Your physician recommends that you schedule a follow-up appointment in: 1 year. You will receive a reminder call in about 8 months reminding you to schedule your appointment. If you don't receive this call, please contact our office.   Any Other Special Instructions Will Be Listed Below (If Applicable).  Thank you for choosing [facility]!      If you need a refill on your cardiac medications before your next appointment, please call your pharmacy.

## 2023-03-04 ENCOUNTER — Encounter: Payer: Self-pay | Admitting: Family Medicine

## 2023-03-06 ENCOUNTER — Ambulatory Visit (INDEPENDENT_AMBULATORY_CARE_PROVIDER_SITE_OTHER): Payer: BC Managed Care – PPO | Admitting: Urology

## 2023-03-06 VITALS — BP 117/71 | HR 67

## 2023-03-06 DIAGNOSIS — N210 Calculus in bladder: Secondary | ICD-10-CM

## 2023-03-06 DIAGNOSIS — R9341 Abnormal radiologic findings on diagnostic imaging of renal pelvis, ureter, or bladder: Secondary | ICD-10-CM | POA: Diagnosis not present

## 2023-03-06 DIAGNOSIS — N4340 Spermatocele of epididymis, unspecified: Secondary | ICD-10-CM

## 2023-03-06 NOTE — Progress Notes (Unsigned)
03/06/2023 2:48 PM   Timothy Cook 06-Jul-1965 [id number]  Referring provider: Babs Sciara, MD [address] [place],  [place] [address]  No chief complaint on file.   HPI:  F/u -    1) left spermatocele - noted on yearly exam Apr 2024 by Dr. Gerda Diss. Previously seen by [facility] in [facility] Oct 2020 but [place] reports left varicocele and right cyst. He hasn't noticed much of a change. He had a left hydrocele repaired in his 30s. Exam Jul 2024- shows a LEFT spermatocele. No varicocele on either side. He recalls a low T. He never had kids. His wife had fibroids. Ovarian cysts. No scrotal surgery.    3) BPH - His PSA was 0.5 in Apr 2024. Take tadalafil for bph, frequency.   4) ED - takes sildenafil for ED. He noted decreased libido and decreased ejaculate. Weight gain.    Today, seen for the above.  His April 2024 PSA was 0.5.  I repeated the scrotal ultrasound July 2024-this showed normal testicles.  8 mm right spermatocele, normal left epididymis with a 15 mm left scrotal mass/spermatocele (possible within or adjacent to left epidydimis) which was about 11 mm in 2020. It gets in the way of his work. He climbs under and over the presses.   New issue -- he had urinary frequency August 2024.  His wife called and said he had a history of a bladder stone. He recalls falling at work and getting an xray around 2019 and was told he had a bladder stone. He hasn't followed up on that since. He underwent a KUB September 2024 which indeed showed a 3 cm opacity in the region of the bladder.  He has no dysuria or gross hematuria.    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     PMH: Past Medical History:  Diagnosis Date   BPH (benign prostatic hyperplasia)     Surgical History: Past Surgical History:  Procedure Laterality Date   HYDROCELE EXCISION / REPAIR  1993   TONSILLECTOMY     UMBILICAL HERNIA REPAIR  2009   WISDOM TOOTH EXTRACTION      Home  Medications:  Allergies as of 03/06/2023   No Known Allergies      Medication List        Accurate as of March 06, 2023  2:48 PM. If you have any questions, ask your nurse or doctor.          aspirin EC 81 MG tablet Take 1 tablet (81 mg total) by mouth daily. Swallow whole.   Cialis 5 MG tablet Generic drug: tadalafil Take 5 mg by mouth daily. BPH   cyclobenzaprine 10 MG tablet Commonly known as: FLEXERIL Take one po at bedtime as needed for muscle spasms   penicillin v potassium 500 MG tablet Commonly known as: VEETID Take one tab po QID prn mouth infection   rosuvastatin 40 MG tablet Commonly known as: CRESTOR Take 1 tablet (40 mg total) by mouth daily.   sildenafil 100 MG tablet Commonly known as: VIAGRA Take 100 mg by mouth as needed for erectile dysfunction.        Allergies: No Known Allergies  Family History: Family History  Problem Relation Age of Onset   Liver cancer Mother 27   Heart attack Mother 59   Lung cancer Mother 63       mets from liver   Bone cancer Mother 3  mets from liver   Prostate cancer Father 64   Healthy Brother    Healthy Brother    Prostate cancer Paternal Uncle    Prostate cancer Paternal Uncle    Prostate cancer Paternal Uncle    Prostate cancer Paternal Uncle    Throat cancer Paternal Uncle    Colon polyps Neg Hx    Colon cancer Neg Hx    Esophageal cancer Neg Hx    Stomach cancer Neg Hx    Rectal cancer Neg Hx     Social History:  reports that he quit smoking about 7 months ago. His smoking use included cigarettes. He has never used smokeless tobacco. He reports that he does not currently use alcohol. He reports that he does not currently use drugs.   Physical Exam: BP 117/71   Pulse 67   Constitutional:  Alert and oriented, No acute distress. HEENT: [place] AT, moist mucus membranes.  Trachea midline, no masses. Cardiovascular: No clubbing, cyanosis, or edema. Respiratory: Normal respiratory effort, no  increased work of breathing. GI: Abdomen is soft, nontender, nondistended, no abdominal masses GU: No CVA tenderness Lymph: No cervical or inguinal lymphadenopathy. Skin: No rashes, bruises or suspicious lesions. Neurologic: Grossly intact, no focal deficits, moving all 4 extremities. Psychiatric: Normal mood and affect. GU: Penis circumcised, normal foreskin, testicles descended bilaterally and palpably normal, right epididymis 8 mm spermatocele, left epididymis with a 15 mm hard spermatocele. Scrotum normal.    Laboratory Data: Lab Results  Component Value Date   WBC 13.4 (H) 08/18/2022   HGB 17.5 08/18/2022   HCT 51.3 (H) 08/18/2022   MCV 97 08/18/2022   PLT CANCELED 08/18/2022    Lab Results  Component Value Date   CREATININE 0.99 08/18/2022    No results found for: "PSA"  No results found for: "TESTOSTERONE"  No results found for: "HGBA1C"  Urinalysis    Component Value Date/Time   APPEARANCEUR Clear 10/31/2022 1459   GLUCOSEU Negative 10/31/2022 1459   BILIRUBINUR Negative 10/31/2022 1459   PROTEINUR Negative 10/31/2022 1459   NITRITE Negative 10/31/2022 1459   LEUKOCYTESUR Negative 10/31/2022 1459    Lab Results  Component Value Date   LABMICR Comment 10/31/2022    Pertinent Imaging: KUB and scrotal US images reviewed   Results for orders placed in visit on 01/16/23  DG Abd 1 View  No results found for this or any previous visit.  No results found for this or any previous visit.  No results found for this or any previous visit.  No results found for this or any previous visit.  No valid procedures specified. No results found for this or any previous visit.  No results found for this or any previous visit.   Assessment & Plan:    1. Spermatocele of epididymis It is bothersome and gets in the way. Also appearance and quality (hardness) slightly atypical. Discussed the nature rba to left spermatocele and he will proceed.   2. Bladder stone  - discussed likely a bladder stone but needs confirmation. Discussed cystoscopy with possible laser lithotripsy or ultrasonic lithotripsy. Discussed he may need a catheter.   He will proceed - discussed he may need a staged procedure. Will plan to proceed at [facility] in [place] if prior auth is successful, otw discussed a colleague may do the procedures here.    - Urinalysis, Routine w reflex microscopic   No follow-ups on file.  Jerilee Field, MD  [facility]  [address]  [place], [place] [address] [phone]

## 2023-03-07 LAB — URINALYSIS, ROUTINE W REFLEX MICROSCOPIC
Bilirubin, UA: NEGATIVE
Glucose, UA: NEGATIVE
Ketones, UA: NEGATIVE
Nitrite, UA: NEGATIVE
Protein,UA: NEGATIVE
RBC, UA: NEGATIVE
Specific Gravity, UA: 1.01 (ref 1.005–1.030)
Urobilinogen, Ur: 0.2 mg/dL (ref 0.2–1.0)
pH, UA: 8 — ABNORMAL HIGH (ref 5.0–7.5)

## 2023-03-07 LAB — MICROSCOPIC EXAMINATION: Bacteria, UA: NONE SEEN

## 2023-03-15 ENCOUNTER — Other Ambulatory Visit: Payer: Self-pay | Admitting: Urology

## 2023-03-18 ENCOUNTER — Other Ambulatory Visit: Payer: Self-pay | Admitting: Nurse Practitioner

## 2023-04-06 ENCOUNTER — Ambulatory Visit: Payer: BC Managed Care – PPO | Admitting: Family Medicine

## 2023-04-24 NOTE — Patient Instructions (Signed)
SURGICAL WAITING ROOM VISITATION  Patients having surgery or a procedure may have no more than 2 support people in the waiting area - these visitors may rotate.    Children under the age of 61 must have an adult with them who is not the patient.  Due to an increase in RSV and influenza rates and associated hospitalizations, children ages 26 and under may not visit patients in [facility] hospitals.  If the patient needs to stay at the hospital during part of their recovery, the visitor guidelines for inpatient rooms apply. Pre-op nurse will coordinate an appropriate time for 1 support person to accompany patient in pre-op.  This support person may not rotate.    Please refer to the [facility] website for the visitor guidelines for Inpatients (after your surgery is over and you are in a regular room).       Your procedure is scheduled on: 05/12/23   Report to [facility] Main Entrance    Report to admitting at  9:15 AM   Call this number if you have problems the morning of surgery [phone]   Do not eat food or drink liquids  :After Midnight.    Oral Hygiene is also important to reduce your risk of infection.                                    Remember - BRUSH YOUR TEETH THE MORNING OF SURGERY WITH YOUR REGULAR TOOTHPASTE   Stop all vitamins and herbal supplements 7 days before surgery.   Take these medicines the morning of surgery with A SIP OF WATER: Rosuvastatin, Tylenol if needed.             You may not have any metal on your body including hair pins, jewelry, and body piercing             Do not wear make-up, lotions, powders, perfumes/cologne, or deodorant  Do not wear nail polish including gel and S&S, artificial/acrylic nails, or any other type of covering on natural nails including finger and toenails. If you have artificial nails, gel coating, etc. that needs to be removed by a nail salon please have this removed prior to surgery or surgery may need to be  canceled/ delayed if the surgeon/ anesthesia feels like they are unable to be safely monitored.   Do not shave  48 hours prior to surgery.               Men may shave face and neck.   Do not bring valuables to the hospital. [facility] IS NOT             RESPONSIBLE   FOR VALUABLES.   Contacts, glasses, dentures or bridgework may not be worn into surgery.  DO NOT BRING YOUR HOME MEDICATIONS TO THE HOSPITAL. PHARMACY WILL DISPENSE MEDICATIONS LISTED ON YOUR MEDICATION LIST TO YOU DURING YOUR ADMISSION IN THE HOSPITAL!    Patients discharged on the day of surgery will not be allowed to drive home.  Someone NEEDS to stay with you for the first 24 hours after anesthesia.   Special Instructions: Bring a copy of your healthcare power of attorney and living will documents the day of surgery if you haven't scanned them before.              Please read over the following fact sheets you were given: IF YOU HAVE  QUESTIONS ABOUT YOUR PRE-OP INSTRUCTIONS PLEASE CALL [phone] Rosey Bath   If you received a COVID test during your pre-op visit  it is requested that you wear a mask when out in public, stay away from anyone that may not be feeling well and notify your surgeon if you develop symptoms. If you test positive for Covid or have been in contact with anyone that has tested positive in the last 10 days please notify you surgeon.    [place] - Preparing for Surgery Before surgery, you can play an important role.  Because skin is not sterile, your skin needs to be as free of germs as possible.  You can reduce the number of germs on your skin by washing with CHG (chlorahexidine gluconate) soap before surgery.  CHG is an antiseptic cleaner which kills germs and bonds with the skin to continue killing germs even after washing. Please DO NOT use if you have an allergy to CHG or antibacterial soaps.  If your skin becomes reddened/irritated stop using the CHG and inform your nurse when you arrive at Short  Stay. Do not shave (including legs and underarms) for at least 48 hours prior to the first CHG shower.  You may shave your face/neck.  Please follow these instructions carefully:  1.  Shower with CHG Soap the night before surgery and the  morning of surgery.  2.  If you choose to wash your hair, wash your hair first as usual with your normal  shampoo.  3.  After you shampoo, rinse your hair and body thoroughly to remove the shampoo.                             4.  Use CHG as you would any other liquid soap.  You can apply chg directly to the skin and wash.  Gently with a scrungie or clean washcloth.  5.  Apply the CHG Soap to your body ONLY FROM THE NECK DOWN.   Do   not use on face/ open                           Wound or open sores. Avoid contact with eyes, ears mouth and   genitals (private parts).                       Wash face,  Genitals (private parts) with your normal soap.             6.  Wash thoroughly, paying special attention to the area where your    surgery  will be performed.  7.  Thoroughly rinse your body with warm water from the neck down.  8.  DO NOT shower/wash with your normal soap after using and rinsing off the CHG Soap.                9.  Pat yourself dry with a clean towel.            10.  Wear clean pajamas.            11.  Place clean sheets on your bed the night of your first shower and do not  sleep with pets. Day of Surgery : Do not apply any lotions/deodorants the morning of surgery.  Please wear clean clothes to the hospital/surgery center.  FAILURE TO FOLLOW THESE INSTRUCTIONS MAY RESULT IN THE CANCELLATION OF YOUR  SURGERY  PATIENT SIGNATURE_________________________________  NURSE SIGNATURE__________________________________  ________________________________________________________________________

## 2023-04-24 NOTE — Progress Notes (Signed)
COVID Vaccine received:  []  No [x]  Yes Date of any COVID positive Test in last 90 days: no PCP - Lilyan Punt MD Cardiologist - Luane School MD  Cardiac clearance 02/20/23 Dr. Jenene Slicker  Chest x-ray -  EKG -  12/07/22 Epic Stress Test -  ECHO -  Cardiac Cath -   Bowel Prep - [x]  No  []   Yes ______  Pacemaker / ICD device [x]  No []  Yes   Spinal Cord Stimulator:[x]  No []  Yes       History of Sleep Apnea? [x]  No []  Yes   CPAP used?- [x]  No []  Yes    Does the patient monitor blood sugar?          [x]  No []  Yes  []  N/A  Patient has: [x]  NO Hx DM   []  Pre-DM                 []  DM1  []   DM2 Does patient have a [employer] or Dexacom? []  No []  Yes   Fasting Blood Sugar Ranges-  Checks Blood Sugar _____ times a day  GLP1 agonist / usual dose - no GLP1 instructions:  SGLT-2 inhibitors / usual dose - no SGLT-2 instructions:   Blood Thinner / Instructions:no Aspirin Instructions:Stopped ASA 2 weeks ago  Comments:   Activity level: Patient is able to climb a flight of stairs without difficulty; [x]  No CP  [x]  No SOB, ___   Patient can perform ADLs without assistance.   Anesthesia review: Elevated coronary artery calcium score  Patient denies shortness of breath, fever, cough and chest pain at PAT appointment.  Patient verbalized understanding and agreement to the Pre-Surgical Instructions that were given to them at this PAT appointment. Patient was also educated of the need to review these PAT instructions again prior to his/her surgery.I reviewed the appropriate phone numbers to call if they have any and questions or concerns.

## 2023-04-27 ENCOUNTER — Encounter (HOSPITAL_COMMUNITY): Payer: Self-pay

## 2023-04-27 ENCOUNTER — Encounter (HOSPITAL_COMMUNITY)
Admission: RE | Admit: 2023-04-27 | Discharge: 2023-04-27 | Disposition: A | Discharge status: Home / Self Care | Payer: BC Managed Care – PPO | Source: Ambulatory Visit | Attending: Urology | Admitting: Urology

## 2023-04-27 ENCOUNTER — Ambulatory Visit: Payer: BC Managed Care – PPO | Admitting: Family Medicine

## 2023-04-27 ENCOUNTER — Other Ambulatory Visit: Payer: Self-pay

## 2023-04-27 VITALS — BP 124/70 | HR 67 | Temp 98.1°F | Ht 74.0 in | Wt 229.0 lb

## 2023-04-27 VITALS — BP 141/72 | HR 63 | Temp 98.1°F | Resp 16 | Ht 74.0 in | Wt 232.0 lb

## 2023-04-27 DIAGNOSIS — N401 Enlarged prostate with lower urinary tract symptoms: Secondary | ICD-10-CM | POA: Diagnosis not present

## 2023-04-27 DIAGNOSIS — Z01818 Encounter for other preprocedural examination: Secondary | ICD-10-CM

## 2023-04-27 DIAGNOSIS — Z01812 Encounter for preprocedural laboratory examination: Secondary | ICD-10-CM | POA: Diagnosis not present

## 2023-04-27 DIAGNOSIS — R931 Abnormal findings on diagnostic imaging of heart and coronary circulation: Secondary | ICD-10-CM

## 2023-04-27 DIAGNOSIS — E785 Hyperlipidemia, unspecified: Secondary | ICD-10-CM | POA: Diagnosis not present

## 2023-04-27 DIAGNOSIS — Z125 Encounter for screening for malignant neoplasm of prostate: Secondary | ICD-10-CM | POA: Diagnosis not present

## 2023-04-27 DIAGNOSIS — R350 Frequency of micturition: Secondary | ICD-10-CM

## 2023-04-27 HISTORY — DX: Unspecified osteoarthritis, unspecified site: M19.90

## 2023-04-27 HISTORY — DX: Personal history of urinary calculi: Z87.442

## 2023-04-27 HISTORY — DX: Personal history of other diseases of the digestive system: Z87.19

## 2023-04-27 LAB — CBC
HCT: 51.4 % (ref 39.0–52.0)
Hemoglobin: 17 g/dL (ref 13.0–17.0)
MCH: 31.4 pg (ref 26.0–34.0)
MCHC: 33.1 g/dL (ref 30.0–36.0)
MCV: 95 fL (ref 80.0–100.0)
Platelets: UNDETERMINED 10*3/uL (ref 150–400)
RBC: 5.41 MIL/uL (ref 4.22–5.81)
RDW: 12.7 % (ref 11.5–15.5)
WBC: 7.2 10*3/uL (ref 4.0–10.5)
nRBC: 0 % (ref 0.0–0.2)

## 2023-04-27 NOTE — Progress Notes (Signed)
   Subjective:    Patient ID: Timothy Cook, male    DOB: 1965-11-22, 57 y.o.   MRN: [id number]  HPI Patient relates she is trying to do good job with healthy eating regular activity He is taking the increased dose of cholesterol medicine to get the LDL down He denies any chest tightness pressure pain He is taking his cholesterol medicine and was taking aspirin but he has surgery coming up for a bladder stone He also recently had 18 extractions and will be getting implants in January   Review of Systems     Objective:   Physical Exam General-in no acute distress Eyes-no discharge Lungs-respiratory rate normal, CTA CV-no murmurs,RRR Extremities skin warm dry no edema Neuro grossly normal Behavior normal, alert        Assessment & Plan:  Hyperlipidemia Goal is to get LDL below 70 and if possible below 55 Continue statin Check lab work in March  Wellness in April  Patient to follow-up sooner if any problems

## 2023-05-02 ENCOUNTER — Other Ambulatory Visit: Payer: Self-pay | Admitting: Family Medicine

## 2023-05-11 NOTE — Anesthesia Preprocedure Evaluation (Addendum)
 Anesthesia Evaluation  Patient identified by MRN, date of birth, ID band Patient awake    Reviewed: Allergy & Precautions, NPO status , Patient's Chart, lab work & pertinent test results  Airway Mallampati: I  TM Distance: >3 FB Neck ROM: Full    Dental no notable dental hx. (+) Edentulous Upper, Edentulous Lower   Pulmonary former smoker   Pulmonary exam normal breath sounds clear to auscultation       Cardiovascular + CAD  Normal cardiovascular exam Rhythm:Regular Rate:Normal     Neuro/Psych negative neurological ROS  negative psych ROS   GI/Hepatic Neg liver ROS, hiatal hernia,,,  Endo/Other  negative endocrine ROS    Renal/GU      Musculoskeletal  (+) Arthritis , Osteoarthritis,    Abdominal   Peds  Hematology negative hematology ROS (+) Lab Results      Component                Value               Date                      WBC                      7.2                 04/27/2023                HGB                      17.0                04/27/2023                HCT                      51.4                04/27/2023                MCV                      95.0                04/27/2023                PLT                                          04/27/2023            PLATELET CLUMPS NOTED ON SMEAR, UNABLE TO ESTIMATE    Anesthesia Other Findings   Reproductive/Obstetrics                             Anesthesia Physical Anesthesia Plan  ASA: 2  Anesthesia Plan: General   Post-op Pain Management:    Induction: Intravenous  PONV Risk Score and Plan: Midazolam , Ondansetron , Treatment may vary due to age or medical condition and Dexamethasone   Airway Management Planned: Oral ETT  Additional Equipment: None  Intra-op Plan:   Post-operative Plan: Extubation in OR  Informed Consent: I have reviewed the patients History and Physical, chart, labs and discussed the procedure  including the risks, benefits and  alternatives for the proposed anesthesia with the patient or authorized representative who has indicated his/her understanding and acceptance.     Dental advisory given  Plan Discussed with: CRNA and Anesthesiologist  Anesthesia Plan Comments:         Anesthesia Quick Evaluation

## 2023-05-12 ENCOUNTER — Other Ambulatory Visit (HOSPITAL_COMMUNITY): Payer: Self-pay

## 2023-05-12 ENCOUNTER — Encounter (HOSPITAL_COMMUNITY): Payer: Self-pay | Admitting: Urology

## 2023-05-12 ENCOUNTER — Encounter (HOSPITAL_COMMUNITY)
Admission: RE | Disposition: A | Discharge status: Home / Self Care | Payer: Self-pay | Source: Home / Self Care | Attending: Urology

## 2023-05-12 ENCOUNTER — Other Ambulatory Visit: Payer: Self-pay

## 2023-05-12 ENCOUNTER — Ambulatory Visit (HOSPITAL_COMMUNITY): Payer: BC Managed Care – PPO | Admitting: Anesthesiology

## 2023-05-12 ENCOUNTER — Ambulatory Visit (HOSPITAL_COMMUNITY)
Admission: RE | Admit: 2023-05-12 | Discharge: 2023-05-12 | Disposition: A | Discharge status: Home / Self Care | Payer: BC Managed Care – PPO | Attending: Urology | Admitting: Urology

## 2023-05-12 DIAGNOSIS — I2510 Atherosclerotic heart disease of native coronary artery without angina pectoris: Secondary | ICD-10-CM | POA: Diagnosis not present

## 2023-05-12 DIAGNOSIS — R350 Frequency of micturition: Secondary | ICD-10-CM | POA: Insufficient documentation

## 2023-05-12 DIAGNOSIS — L729 Follicular cyst of the skin and subcutaneous tissue, unspecified: Secondary | ICD-10-CM | POA: Insufficient documentation

## 2023-05-12 DIAGNOSIS — M271 Giant cell granuloma, central: Secondary | ICD-10-CM | POA: Insufficient documentation

## 2023-05-12 DIAGNOSIS — N4341 Spermatocele of epididymis, single: Secondary | ICD-10-CM | POA: Insufficient documentation

## 2023-05-12 DIAGNOSIS — Z79899 Other long term (current) drug therapy: Secondary | ICD-10-CM | POA: Insufficient documentation

## 2023-05-12 DIAGNOSIS — N401 Enlarged prostate with lower urinary tract symptoms: Secondary | ICD-10-CM | POA: Insufficient documentation

## 2023-05-12 DIAGNOSIS — N5089 Other specified disorders of the male genital organs: Secondary | ICD-10-CM | POA: Diagnosis not present

## 2023-05-12 DIAGNOSIS — D4959 Neoplasm of unspecified behavior of other genitourinary organ: Secondary | ICD-10-CM | POA: Diagnosis not present

## 2023-05-12 DIAGNOSIS — N4340 Spermatocele of epididymis, unspecified: Secondary | ICD-10-CM | POA: Diagnosis not present

## 2023-05-12 DIAGNOSIS — N442 Benign cyst of testis: Secondary | ICD-10-CM | POA: Diagnosis not present

## 2023-05-12 DIAGNOSIS — N210 Calculus in bladder: Secondary | ICD-10-CM | POA: Diagnosis not present

## 2023-05-12 HISTORY — PX: CYSTOSCOPY WITH LITHOLAPAXY: SHX1425

## 2023-05-12 HISTORY — PX: SPERMATOCELECTOMY: SHX2420

## 2023-05-12 SURGERY — EXCISION, SPERMATOCELE
Anesthesia: General

## 2023-05-12 MED ORDER — CHLORHEXIDINE GLUCONATE 0.12 % MT SOLN
15.0000 mL | Freq: Once | OROMUCOSAL | Status: AC
  Administered 2023-05-12: 15 mL via OROMUCOSAL

## 2023-05-12 MED ORDER — LIDOCAINE 2% (20 MG/ML) 5 ML SYRINGE
INTRAMUSCULAR | Status: DC | PRN
  Administered 2023-05-12: 100 mg via INTRAVENOUS

## 2023-05-12 MED ORDER — FENTANYL CITRATE (PF) 100 MCG/2ML IJ SOLN
INTRAMUSCULAR | Status: DC | PRN
  Administered 2023-05-12: 75 ug via INTRAVENOUS
  Administered 2023-05-12 (×5): 25 ug via INTRAVENOUS

## 2023-05-12 MED ORDER — ROCURONIUM BROMIDE 10 MG/ML (PF) SYRINGE
PREFILLED_SYRINGE | INTRAVENOUS | Status: AC
  Filled 2023-05-12: qty 10

## 2023-05-12 MED ORDER — SODIUM CHLORIDE 0.9 % IV SOLN: INTRAVENOUS | Status: DC

## 2023-05-12 MED ORDER — FENTANYL CITRATE (PF) 100 MCG/2ML IJ SOLN
INTRAMUSCULAR | Status: AC
  Filled 2023-05-12: qty 2

## 2023-05-12 MED ORDER — ONDANSETRON HCL 4 MG/2ML IJ SOLN: 4.0000 mg | Freq: Once | INTRAMUSCULAR | Status: DC | PRN

## 2023-05-12 MED ORDER — ORAL CARE MOUTH RINSE: 15.0000 mL | Freq: Once | OROMUCOSAL | Status: AC

## 2023-05-12 MED ORDER — LACTATED RINGERS IV SOLN
INTRAVENOUS | Status: DC
Start: 2023-05-12 — End: 2023-05-12

## 2023-05-12 MED ORDER — MIDAZOLAM HCL 5 MG/5ML IJ SOLN
INTRAMUSCULAR | Status: DC | PRN
  Administered 2023-05-12: 2 mg via INTRAVENOUS

## 2023-05-12 MED ORDER — OXYCODONE HCL 5 MG/5ML PO SOLN: 5.0000 mg | Freq: Once | ORAL | Status: DC | PRN

## 2023-05-12 MED ORDER — SODIUM CHLORIDE 0.9 % IV SOLN
2.0000 g | INTRAVENOUS | Status: AC
  Administered 2023-05-12: 2 g via INTRAVENOUS
  Filled 2023-05-12: qty 20

## 2023-05-12 MED ORDER — 0.9 % SODIUM CHLORIDE (POUR BTL) OPTIME
TOPICAL | Status: DC | PRN
  Administered 2023-05-12: 1000 mL

## 2023-05-12 MED ORDER — MIDAZOLAM HCL 2 MG/2ML IJ SOLN
INTRAMUSCULAR | Status: AC
  Filled 2023-05-12: qty 2

## 2023-05-12 MED ORDER — HYDROMORPHONE HCL 1 MG/ML IJ SOLN: 0.2500 mg | INTRAMUSCULAR | Status: DC | PRN

## 2023-05-12 MED ORDER — DEXAMETHASONE SODIUM PHOSPHATE 10 MG/ML IJ SOLN
INTRAMUSCULAR | Status: AC
  Filled 2023-05-12: qty 1

## 2023-05-12 MED ORDER — ONDANSETRON HCL 4 MG/2ML IJ SOLN
INTRAMUSCULAR | Status: DC | PRN
  Administered 2023-05-12: 4 mg via INTRAVENOUS

## 2023-05-12 MED ORDER — OXYCODONE HCL 5 MG PO TABS: 5.0000 mg | ORAL_TABLET | Freq: Once | ORAL | Status: DC | PRN

## 2023-05-12 MED ORDER — DEXAMETHASONE SODIUM PHOSPHATE 10 MG/ML IJ SOLN
INTRAMUSCULAR | Status: DC | PRN
  Administered 2023-05-12: 8 mg via INTRAVENOUS

## 2023-05-12 MED ORDER — SUGAMMADEX SODIUM 200 MG/2ML IV SOLN
INTRAVENOUS | Status: DC | PRN
  Administered 2023-05-12: 200 mg via INTRAVENOUS

## 2023-05-12 MED ORDER — LIDOCAINE HCL (PF) 2 % IJ SOLN
INTRAMUSCULAR | Status: AC
Start: 2023-05-12 — End: ?
  Filled 2023-05-12: qty 5

## 2023-05-12 MED ORDER — KETOROLAC TROMETHAMINE 30 MG/ML IJ SOLN: 30.0000 mg | Freq: Once | INTRAMUSCULAR | Status: DC | PRN

## 2023-05-12 MED ORDER — ONDANSETRON HCL 4 MG/2ML IJ SOLN
INTRAMUSCULAR | Status: AC
  Filled 2023-05-12: qty 2

## 2023-05-12 MED ORDER — PROPOFOL 10 MG/ML IV BOLUS
INTRAVENOUS | Status: DC | PRN
  Administered 2023-05-12: 170 mg via INTRAVENOUS

## 2023-05-12 MED ORDER — BUPIVACAINE HCL (PF) 0.25 % IJ SOLN
INTRAMUSCULAR | Status: DC | PRN
  Administered 2023-05-12: 5 mL

## 2023-05-12 MED ORDER — PROPOFOL 10 MG/ML IV BOLUS
INTRAVENOUS | Status: AC
  Filled 2023-05-12: qty 20

## 2023-05-12 MED ORDER — BUPIVACAINE HCL 0.25 % IJ SOLN
INTRAMUSCULAR | Status: AC
  Filled 2023-05-12: qty 1

## 2023-05-12 MED ORDER — CEPHALEXIN 500 MG PO CAPS
500.0000 mg | ORAL_CAPSULE | Freq: Every day | ORAL | 0 refills | Status: AC
  Filled 2023-05-12: qty 10, 10d supply, fill #0

## 2023-05-12 MED ORDER — SODIUM CHLORIDE 0.9 % IR SOLN
Status: DC | PRN
  Administered 2023-05-12 (×8): 3000 mL via INTRAVESICAL

## 2023-05-12 MED ORDER — ROCURONIUM BROMIDE 10 MG/ML (PF) SYRINGE
PREFILLED_SYRINGE | INTRAVENOUS | Status: DC | PRN
  Administered 2023-05-12: 10 mg via INTRAVENOUS
  Administered 2023-05-12: 60 mg via INTRAVENOUS

## 2023-05-12 SURGICAL SUPPLY — 36 items
BAG COUNTER SPONGE SURGICOUNT (BAG) IMPLANT
BAG URINE DRAIN 2000ML AR STRL (UROLOGICAL SUPPLIES) IMPLANT
BAG URO CATCHER STRL LF (MISCELLANEOUS) ×2 IMPLANT
BNDG GAUZE DERMACEA FLUFF 4 (GAUZE/BANDAGES/DRESSINGS) ×2 IMPLANT
CATH FOLEY 2WAY 5CC 20FR (CATHETERS) IMPLANT
CATH FOLEY 2WAY SLVR 5CC 18FR (CATHETERS) IMPLANT
CLOTH BEACON ORANGE TIMEOUT ST (SAFETY) ×2 IMPLANT
COVER SURGICAL LIGHT HANDLE (MISCELLANEOUS) ×2 IMPLANT
DRAPE LAPAROTOMY T 98X78 PEDS (DRAPES) IMPLANT
DRSG TELFA 4X10 ISLAND STR (GAUZE/BANDAGES/DRESSINGS) IMPLANT
ELECT REM PT RETURN 15FT ADLT (MISCELLANEOUS) ×2 IMPLANT
FIBER LASER MOSES 550 DFL (Laser) IMPLANT
GAUZE SPONGE 4X4 12PLY STRL (GAUZE/BANDAGES/DRESSINGS) IMPLANT
GLOVE SURG LX STRL 7.5 STRW (GLOVE) ×2 IMPLANT
GOWN STRL REUS W/ TWL XL LVL3 (GOWN DISPOSABLE) ×2 IMPLANT
KIT BASIN OR (CUSTOM PROCEDURE TRAY) ×2 IMPLANT
KIT TURNOVER KIT A (KITS) IMPLANT
MANIFOLD NEPTUNE II (INSTRUMENTS) ×2 IMPLANT
NDL HYPO 22X1.5 SAFETY MO (MISCELLANEOUS) IMPLANT
NEEDLE HYPO 22X1.5 SAFETY MO (MISCELLANEOUS)
NS IRRIG 1000ML POUR BTL (IV SOLUTION) ×2 IMPLANT
PACK CYSTO (CUSTOM PROCEDURE TRAY) ×2 IMPLANT
PACK GENERAL/GYN (CUSTOM PROCEDURE TRAY) ×2 IMPLANT
SUPPORTER AHLETIC TETRA LG (SOFTGOODS) ×2 IMPLANT
SUT CHROMIC 3 0 SH 27 (SUTURE) ×4 IMPLANT
SUT CHROMIC 4 0 RB 1X27 (SUTURE) IMPLANT
SUT VIC AB 2-0 SH 27X BRD (SUTURE) IMPLANT
SUT VIC AB 2-0 UR5 27 (SUTURE) IMPLANT
SUT VICRYL 0 TIES 12 18 (SUTURE) IMPLANT
SYR CONTROL 10ML LL (SYRINGE) IMPLANT
SYR TOOMEY IRRIG 70ML (MISCELLANEOUS)
SYRINGE TOOMEY IRRIG 70ML (MISCELLANEOUS) IMPLANT
TOWEL OR 17X26 10 PK STRL BLUE (TOWEL DISPOSABLE) ×4 IMPLANT
TUBING CONNECTING 10 (TUBING) IMPLANT
TUBING UROLOGY SET (TUBING) ×2 IMPLANT
WATER STERILE IRR 1000ML POUR (IV SOLUTION) ×2 IMPLANT

## 2023-05-12 NOTE — Discharge Instructions (Signed)
 Removal of the Foley: Remove the Foley on Wednesday morning, May 17, 2023 as instructed.

## 2023-05-12 NOTE — Op Note (Signed)
 Preoperative diagnosis: Left spermatocele/scrotal mass, bladder stone  Postoperative diagnosis: Same  Procedure: 1) scrotal exploration with excision of scrotal mass 2) cystoscopy with laser cystolitholopaxy  Surgeon: Nieves  Anesthesia: General  Indication for procedure: Timothy Cook is a 58 year old male with a bothersome left scrotal mass.  It interfered with his work activities and was also more solid on exam and warranted excision.  He also had a large bladder stone since at least 2019.  Findings: On exam the penis was circumcised without mass or lesion.  The scrotum was normal.  The testicles were palpably normal.  There was a firm left mass closely associated to the edge of the testicle or distal cord.  It ended up being a firm round mass that did not require direct excision from the cord or testicle but was adjacent to it.  However the mass was able to be dissected out from the fascia by itself.  It may be an inclusion cyst or something of that nature.  On cystoscopy the urethra was unremarkable, prostate short and not obstructing.  The trigone and ureteral orifice ease were in their normal orthotopic position but then there was a drop-off behind the trigone with a large capacity bladder.  I discussed with his wife and he does not have any neurologic risk but he may have developed a neurogenic bladder/hypotonic bladder.  She says he voids frequently.  There was a large dense stone in the bladder which was completely fragmented and drained.  Description of procedure: After consent was obtained patient was brought to the operating room and placed supine on the OR table.  After adequate anesthesia the external genitalia were prepped and draped in the usual sterile fashion.  Timeout was performed to confirm the patient and procedure.  The left hemiscrotum was infiltrated with plain Marcaine  and about a 2 to 3 cm left hemiscrotal incision was made.  We dissected that down through the dartos and right  on top of a smooth firm round scrotal mass.  I was able to dissect the dartos fascia off the mass and then off the fascia where it was more adherent closer to the cord or testicle.  The mass was removed intact.  The testicle and the cord were not directly encountered.  The testicles were palpably normal.  The wound was irrigated and the dartos was reapproximated with a 2-0 Vicryl and then the skin was closed with 3 horizontal 3-0 chromic mattress suture.  He was then placed in lithotomy position and reprepped and draped.  Another timeout was performed.  Cystoscope was then passed per urethra and the bladder inspected.  Then using the continuous-flow laser sheath a 550 m Moses laser fiber was passed and the stone was dusted and fragmented.  We went through 3 laser fibers.  All the fragments were evacuated.  I gave the fragments to his wife.  The mucosa in the prostatic urethra and the trigone were edematous from having to and the scope inferiorly where the bladder dropped off like a shelf behind the trigone while lasering the stone and therefore I thought it was wise to leave a Foley catheter to let things settle down and to decompress the bladder.  Therefore a 20 French coud catheter was advanced without difficulty left to gravity drainage.  Urine was clear.  He was then awakened taken the cover room in stable condition.  Complications: None  Blood loss: Minimal  Specimens: None  Drains: 20 French coud catheter  Disposition: Patient stable to PACU.  I went over the procedure, postop care and follow-up with his wife.

## 2023-05-12 NOTE — H&P (Signed)
 H&P  Chief Complaint: Left spermatocele, bladder stone  History of Present Illness: Timothy Cook is a 58 year old male with a history of BPH.  He is on daily tadalafil  for urinary frequency.  He recalls falling at work and having an x-ray in 2019 which revealed a bladder stone.  He had not thought much about until recently.  A September 2024 KUB revealed a 3 cm opacity in the region of the bladder.  He is also had a bothersome left spermatocele.  On exam he had about a 15 mm left spermatocele.  He underwent a July 2024 scrotal ultrasound which revealed a 15 mm shadowing mass in the left aspect of the hemiscrotum.  On ultrasound it did not appear to originate from the testicle or the epididymis.  It was thought to have increased in size from 11 mm on the 2020 scrotal ultrasound.  The spermatocele/mass interferes with his daily activities as he frequently has to climb over and under presses at work and it causes discomfort.  He presents today for left spermatocelectomy/scrotal exploration with mass excision, cystoscopy with laser cystolitholopaxy possible LithoClast trilogy.  He has been well without cough cold or congestion.  No dysuria or gross hematuria.  Past Medical History:  Diagnosis Date   Arthritis    BPH (benign prostatic hyperplasia)    History of hiatal hernia    History of kidney stones    Past Surgical History:  Procedure Laterality Date   HYDROCELE EXCISION / REPAIR  1993   TONSILLECTOMY     UMBILICAL HERNIA REPAIR  2009   WISDOM TOOTH EXTRACTION      Home Medications:  No medications prior to admission.   Allergies: No Known Allergies  Family History  Problem Relation Age of Onset   Liver cancer Mother 1   Heart attack Mother 15   Lung cancer Mother 33       mets from liver   Bone cancer Mother 44       mets from liver   Prostate cancer Father 29   Healthy Brother    Healthy Brother    Prostate cancer Paternal Uncle    Prostate cancer Paternal Uncle    Prostate  cancer Paternal Uncle    Prostate cancer Paternal Uncle    Throat cancer Paternal Uncle    Colon polyps Neg Hx    Colon cancer Neg Hx    Esophageal cancer Neg Hx    Stomach cancer Neg Hx    Rectal cancer Neg Hx    Social History:  reports that he quit smoking about 9 months ago. His smoking use included cigarettes. He has never used smokeless tobacco. He reports that he does not currently use alcohol. He reports that he does not currently use drugs.  ROS: A complete review of systems was performed.  All systems are negative except for pertinent findings as noted. Review of Systems  All other systems reviewed and are negative.    Physical Exam:  Vital signs in last 24 hours:   General:  Alert and oriented, No acute distress HEENT: Normocephalic, atraumatic Neck: No JVD or lymphadenopathy Cardiovascular: Regular rate and rhythm Lungs: Regular rate and effort Abdomen: Soft, nontender, nondistended, no abdominal masses Back: No CVA tenderness Extremities: No edema Neurologic: Grossly intact GU: Penis circumcised.  Scrotum normal.  Right testicle palpably normal.  Left testicle palpably normal and at the head of the epididymis or potentially by the distal cord there is a firm 15 mm nodule.  On further exam  today it does seem to be a solid and possibly associated with the distal cord just above the testicle.  Laboratory Data:  No results found for this or any previous visit (from the past 24 hours). No results found for this or any previous visit (from the past 240 hours). Creatinine: No results for input(s): CREATININE in the last 168 hours.  Impression/Assessment:  Left spermatocele/mass, bladder stone-  Plan:  I discussed with the patient the nature, potential benefits, risks and alternatives to laser scrotal exploration with left spermatocelectomy/mass excision and cystolitholopaxy possible trilogy, including side effects of the proposed treatment, the likelihood of the  patient achieving the goals of the procedure, and any potential problems that might occur during the procedure or recuperation.  Discussed with patient and wife possibility of left orchiectomy and they would favor removing as much of the mass as possible and leaving the testicle if at all possible and if benign.  We discussed left orchiectomy could lead to infertility but also low testosterone .  He acknowledges body is changing his he is getting older and would like to keep as much testosterone  as possible.  Also discussed possible bladder injury or perforation and need for prolonged Foley catheter among other risks.  All questions answered. Patient elects to proceed.   Donnice Brooks 05/12/2023, 7:49 AM

## 2023-05-12 NOTE — Anesthesia Procedure Notes (Signed)
 Procedure Name: Intubation Date/Time: 05/12/2023 10:40 AM  Performed by: Memory Armida LABOR, CRNAPre-anesthesia Checklist: Patient identified, Emergency Drugs available, Suction available, Patient being monitored and Timeout performed Patient Re-evaluated:Patient Re-evaluated prior to induction Oxygen Delivery Method: Circle system utilized Preoxygenation: Pre-oxygenation with 100% oxygen Induction Type: IV induction Ventilation: Mask ventilation without difficulty and Oral airway inserted - appropriate to patient size Laryngoscope Size: Mac and 4 Grade View: Grade I Tube type: Oral Tube size: 7.5 mm Number of attempts: 1 Airway Equipment and Method: Stylet Placement Confirmation: ETT inserted through vocal cords under direct vision, positive ETCO2 and breath sounds checked- equal and bilateral Secured at: 22 cm Tube secured with: Tape Dental Injury: Teeth and Oropharynx as per pre-operative assessment

## 2023-05-12 NOTE — Transfer of Care (Signed)
 Immediate Anesthesia Transfer of Care Note  Patient: Timothy Cook  Procedure(s) Performed: SPERMATOCELECTOMY (Left) CYSTOSCOPY WITH LITHOLAPAXY  Patient Location: PACU  Anesthesia Type:General  Level of Consciousness: awake, alert , oriented, and patient cooperative  Airway & Oxygen Therapy: Patient Spontanous Breathing and Patient connected to face mask oxygen  Post-op Assessment: Report given to RN, Post -op Vital signs reviewed and stable, and Patient moving all extremities  Post vital signs: Reviewed and stable  Last Vitals:  Vitals Value Taken Time  BP    Temp    Pulse 80 05/12/23 1326  Resp 15 05/12/23 1326  SpO2 97 % 05/12/23 1326  Vitals shown include unfiled device data.  Last Pain:  Vitals:   05/12/23 0933  TempSrc: Oral  PainSc:          Complications: No notable events documented.

## 2023-05-14 NOTE — Anesthesia Postprocedure Evaluation (Signed)
 Anesthesia Post Note  Patient: Timothy Cook  Procedure(s) Performed: SPERMATOCELECTOMY (Left) CYSTOSCOPY WITH LITHOLAPAXY     Patient location during evaluation: PACU Anesthesia Type: General Level of consciousness: awake and alert Pain management: pain level controlled Vital Signs Assessment: post-procedure vital signs reviewed and stable Respiratory status: spontaneous breathing, nonlabored ventilation, respiratory function stable and patient connected to nasal cannula oxygen Cardiovascular status: blood pressure returned to baseline and stable Postop Assessment: no apparent nausea or vomiting Anesthetic complications: no   No notable events documented.  Last Vitals:  Vitals:   05/12/23 1400 05/12/23 1416  BP: (!) 152/83 (!) 157/83  Pulse: 77 79  Resp: 15   Temp:  36.7 C  SpO2: 91% 93%    Last Pain:  Vitals:   05/12/23 1416  TempSrc:   PainSc: 0-No pain                 Garnette DELENA Gab

## 2023-05-15 ENCOUNTER — Encounter (HOSPITAL_COMMUNITY): Payer: Self-pay | Admitting: Urology

## 2023-05-15 LAB — SURGICAL PATHOLOGY

## 2023-05-22 ENCOUNTER — Encounter: Payer: Self-pay | Admitting: Urology

## 2023-05-22 ENCOUNTER — Ambulatory Visit (INDEPENDENT_AMBULATORY_CARE_PROVIDER_SITE_OTHER): Payer: BC Managed Care – PPO | Admitting: Urology

## 2023-05-22 VITALS — BP 120/74 | HR 71

## 2023-05-22 DIAGNOSIS — L720 Epidermal cyst: Secondary | ICD-10-CM | POA: Diagnosis not present

## 2023-05-22 DIAGNOSIS — N138 Other obstructive and reflux uropathy: Secondary | ICD-10-CM

## 2023-05-22 DIAGNOSIS — N401 Enlarged prostate with lower urinary tract symptoms: Secondary | ICD-10-CM | POA: Diagnosis not present

## 2023-05-22 LAB — URINALYSIS, ROUTINE W REFLEX MICROSCOPIC
Bilirubin, UA: NEGATIVE
Glucose, UA: NEGATIVE
Ketones, UA: NEGATIVE
Nitrite, UA: NEGATIVE
Specific Gravity, UA: 1.015 (ref 1.005–1.030)
Urobilinogen, Ur: 0.2 mg/dL (ref 0.2–1.0)
pH, UA: 8.5 — ABNORMAL HIGH (ref 5.0–7.5)

## 2023-05-22 LAB — MICROSCOPIC EXAMINATION: RBC, Urine: >30 /[HPF] — AB (ref 0–2)

## 2023-05-22 MED ORDER — TAMSULOSIN HCL 0.4 MG PO CAPS: 0.4000 mg | ORAL_CAPSULE | Freq: Every day | ORAL | 3 refills | Status: DC

## 2023-05-22 NOTE — Progress Notes (Signed)
05/22/2023 9:19 AM   Timothy Cook 11/13/1965 [id number]  Referring provider: Babs Sciara, MD [address] [place],  [place] [address]  No chief complaint on file.   HPI:  F/u -       1) BPH, LUTS  - His PSA was 0.5 in Apr 2024. His April 2024 PSA was 0.5. Take tadalafil for bph, frequency. H/o bladder stone noted on xray for work in 2019. LUB Sep 2024 with 3 cm bladder stone. S/p laser CLP in Jan 2025. Pt with LARGE capacity bladder - possibly NGB. Mild BPH. However, patient noted intermittent stream with the stone.    2) ED - takes sildenafil for ED. He noted decreased libido and decreased ejaculate. Weight gain.    3) left scrotal inclusion cyst - s/p excision Jan 2025. Jul 2024 scrotal US benign.  He has no dysuria or gross hematuria.   Today, seen for the above. I left a foley for 5 days following the CLP to decompress the bladder. He removed the foley. No incontinence. No frequency. Some urgency. Stream varies. Mild dysuria. He took last dose cephalexin today. PVR 1100.   Reviewed benign scrotal path.    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      PMH: Past Medical History:  Diagnosis Date   Arthritis    BPH (benign prostatic hyperplasia)    History of hiatal hernia    History of kidney stones     Surgical History: Past Surgical History:  Procedure Laterality Date   CYSTOSCOPY WITH LITHOLAPAXY N/A 05/12/2023   Procedure: CYSTOSCOPY WITH LITHOLAPAXY;  Surgeon: Jerilee Field, MD;  Location: [facility];  Service: Urology;  Laterality: N/A;   HYDROCELE EXCISION / REPAIR  1993   SPERMATOCELECTOMY Left 05/12/2023   Procedure: SPERMATOCELECTOMY;  Surgeon: Jerilee Field, MD;  Location: [facility];  Service: Urology;  Laterality: Left;   TONSILLECTOMY     UMBILICAL HERNIA REPAIR  2009   WISDOM TOOTH EXTRACTION      Home Medications:  Allergies as of 05/22/2023   No Known Allergies      Medication List        Accurate as of  May 22, 2023  9:19 AM. If you have any questions, ask your nurse or doctor.          acetaminophen 500 MG tablet Commonly known as: TYLENOL Take 500-1,000 mg by mouth every 6 (six) hours as needed (pain).   aspirin EC 81 MG tablet Take 1 tablet (81 mg total) by mouth daily. Swallow whole.   cephALEXin 500 MG capsule Commonly known as: KEFLEX Take 1 capsule (500 mg total) by mouth at bedtime for 10 days.   Cialis 5 MG tablet Generic drug: tadalafil Take 5 mg by mouth in the morning. BPH   cyclobenzaprine 10 MG tablet Commonly known as: FLEXERIL TAKE 1 TABLET BY MOUTH EVERY DAY AT BEDTIME FOR MUSCLE SPASMS AS NEEDED   ibuprofen 200 MG tablet Commonly known as: ADVIL Take 400-600 mg by mouth every 8 (eight) hours as needed (pain.).   rosuvastatin 40 MG tablet Commonly known as: CRESTOR Take 1 tablet (40 mg total) by mouth daily.   sildenafil 100 MG tablet Commonly known as: VIAGRA Take 100 mg by mouth daily as needed for erectile dysfunction.        Allergies: No Known Allergies  Family History: Family History  Problem Relation Age of Onset   Liver cancer Mother 86  Heart attack Mother 87   Lung cancer Mother 20       mets from liver   Bone cancer Mother 3       mets from liver   Prostate cancer Father 64   Healthy Brother    Healthy Brother    Prostate cancer Paternal Uncle    Prostate cancer Paternal Uncle    Prostate cancer Paternal Uncle    Prostate cancer Paternal Uncle    Throat cancer Paternal Uncle    Colon polyps Neg Hx    Colon cancer Neg Hx    Esophageal cancer Neg Hx    Stomach cancer Neg Hx    Rectal cancer Neg Hx     Social History:  reports that he quit smoking about 9 months ago. His smoking use included cigarettes. He has never used smokeless tobacco. He reports that he does not currently use alcohol. He reports that he does not currently use drugs.   Physical Exam: There were no vitals taken for this visit.  Constitutional:   Alert and oriented, No acute distress. HEENT: [place] AT, moist mucus membranes.  Trachea midline, no masses. Cardiovascular: No clubbing, cyanosis, or edema. Respiratory: Normal respiratory effort, no increased work of breathing. GI: Abdomen is soft, nontender, nondistended, no abdominal masses GU: No CVA tenderness Skin: No rashes, bruises or suspicious lesions. Neurologic: Grossly intact, no focal deficits, moving all 4 extremities. Psychiatric: Normal mood and affect.  Laboratory Data: Lab Results  Component Value Date   WBC 7.2 04/27/2023   HGB 17.0 04/27/2023   HCT 51.4 04/27/2023   MCV 95.0 04/27/2023   PLT PLATELET CLUMPS NOTED ON SMEAR, UNABLE TO ESTIMATE 04/27/2023    Lab Results  Component Value Date   CREATININE 0.99 08/18/2022    No results found for: "PSA"  No results found for: "TESTOSTERONE"  No results found for: "HGBA1C"  Urinalysis    Component Value Date/Time   APPEARANCEUR Clear 03/06/2023 1447   GLUCOSEU Negative 03/06/2023 1447   BILIRUBINUR Negative 03/06/2023 1447   PROTEINUR Negative 03/06/2023 1447   NITRITE Negative 03/06/2023 1447   LEUKOCYTESUR 1+ (A) 03/06/2023 1447    Lab Results  Component Value Date   LABMICR See below: 03/06/2023   WBCUA 6-10 (A) 03/06/2023   LABEPIT 0-10 03/06/2023   BACTERIA None seen 03/06/2023       Assessment & Plan:    1. BPH with obstruction/lower urinary tract symptoms (Primary) - disc nature rb of incomplete bladder emptying. Disc CIC and he understands risk of "renal failure". He declines and will start tamsulosin. Close f/u. Consider UDS.   2. Inclusion cyst - benign. Healing well.    No follow-ups on file.  Jerilee Field, MD  [facility]  [address] [place], [place] [address] [phone]

## 2023-05-22 NOTE — Progress Notes (Signed)
PVR = 1142

## 2023-05-24 LAB — URINE CULTURE: Organism ID, Bacteria: NO GROWTH

## 2023-06-27 ENCOUNTER — Telehealth: Payer: Self-pay

## 2023-06-27 NOTE — Telephone Encounter (Signed)
 Returned call to patient wife who stated patient was in pain, having trouble voiding, and felt like he was not emptying his bladder completely.   Patient states he just passed a stone and his symptoms have subsided.

## 2023-06-28 NOTE — Telephone Encounter (Signed)
 Patient called and made aware.

## 2023-07-03 ENCOUNTER — Ambulatory Visit: Payer: BC Managed Care – PPO | Admitting: Urology

## 2023-07-03 VITALS — BP 118/75 | HR 66

## 2023-07-03 DIAGNOSIS — N210 Calculus in bladder: Secondary | ICD-10-CM

## 2023-07-03 DIAGNOSIS — N139 Obstructive and reflux uropathy, unspecified: Secondary | ICD-10-CM

## 2023-07-03 DIAGNOSIS — R339 Retention of urine, unspecified: Secondary | ICD-10-CM | POA: Diagnosis not present

## 2023-07-03 LAB — BLADDER SCAN AMB NON-IMAGING: Scan Result: 1124

## 2023-07-03 NOTE — Progress Notes (Signed)
 Bladder Scan completed today.  Patient cannot void prior to the bladder scan. Bladder scan result: 1124  Performed By: Alfonse Spruce. CMA  Additional notes-

## 2023-07-03 NOTE — Progress Notes (Signed)
 07/03/2023 9:13 AM   Timothy Cook [birth year] [id number]  Referring provider: Babs Sciara, MD [address] [place],  [place] [address]  No chief complaint on file.   HPI:   F/u -      1) incomplete bladder emptying - took tadalafil for bph, frequency. Bladder stone noted on xray for work in 2019 and confirmed on KUB Sep 2024 with 3 cm bladder stone. S/p laser CLP in Jan 2025 with excision of scrotal inclusion cyst. Pt with LARGE capacity bladder - possibly NGB. Mild BPH with short non-obs prostate.  Foley left for 5 days.  Follow-up PVR 1100, but patient relatively asymptomatic. Patient noted intermittent stream with "the stone".   April 2024 PSA was 0.5.   2) ED - takes sildenafil for ED. He noted decreased libido and decreased ejaculate. Weight gain.    Today, seen for the above. Started tamsulosin Jan 2025. He could not give a specimen today. He has a pvr 1100+. No painful inability to void. He typically voids sitting with a good stream. No frequency. No incontinence (improved after stone removal). He passed a stone last week.    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  PMH: Past Medical History:  Diagnosis Date   Arthritis    BPH (benign prostatic hyperplasia)    History of hiatal hernia    History of kidney stones     Surgical History: Past Surgical History:  Procedure Laterality Date   CYSTOSCOPY WITH LITHOLAPAXY N/A 05/12/2023   Procedure: CYSTOSCOPY WITH LITHOLAPAXY;  Surgeon: Jerilee Field, MD;  Location: [facility];  Service: Urology;  Laterality: N/A;   HYDROCELE EXCISION / REPAIR  1993   SPERMATOCELECTOMY Left 05/12/2023   Procedure: SPERMATOCELECTOMY;  Surgeon: Jerilee Field, MD;  Location: [facility];  Service: Urology;  Laterality: Left;   TONSILLECTOMY     UMBILICAL HERNIA REPAIR  2009   WISDOM TOOTH EXTRACTION      Home Medications:  Allergies as of 07/03/2023   No Known Allergies      Medication List         Accurate as of July 03, 2023  9:13 AM. If you have any questions, ask your nurse or doctor.          acetaminophen 500 MG tablet Commonly known as: TYLENOL Take 500-1,000 mg by mouth every 6 (six) hours as needed (pain).   aspirin EC 81 MG tablet Take 1 tablet (81 mg total) by mouth daily. Swallow whole.   Cialis 5 MG tablet Generic drug: tadalafil Take 5 mg by mouth in the morning. BPH   cyclobenzaprine 10 MG tablet Commonly known as: FLEXERIL TAKE 1 TABLET BY MOUTH EVERY DAY AT BEDTIME FOR MUSCLE SPASMS AS NEEDED   ibuprofen 200 MG tablet Commonly known as: ADVIL Take 400-600 mg by mouth every 8 (eight) hours as needed (pain.).   rosuvastatin 40 MG tablet Commonly known as: CRESTOR Take 1 tablet (40 mg total) by mouth daily.   sildenafil 100 MG tablet Commonly known as: VIAGRA Take 100 mg by mouth daily as needed for erectile dysfunction.   tamsulosin 0.4 MG Caps capsule Commonly known as: FLOMAX Take 1 capsule (0.4 mg total) by mouth daily after supper.        Allergies: No Known Allergies  Family History: Family History  Problem Relation Age of Onset   Liver cancer Mother 34   Heart attack Mother 36   Lung cancer Mother 79  mets from liver   Bone cancer Mother 78       mets from liver   Prostate cancer Father 90   Healthy Brother    Healthy Brother    Prostate cancer Paternal Uncle    Prostate cancer Paternal Uncle    Prostate cancer Paternal Uncle    Prostate cancer Paternal Uncle    Throat cancer Paternal Uncle    Colon polyps Neg Hx    Colon cancer Neg Hx    Esophageal cancer Neg Hx    Stomach cancer Neg Hx    Rectal cancer Neg Hx     Social History:  reports that he quit smoking about 11 months ago. His smoking use included cigarettes. He has never used smokeless tobacco. He reports that he does not currently use alcohol. He reports that he does not currently use drugs.   Physical Exam: BP 118/75   Pulse 66   Constitutional:  Alert  and oriented, No acute distress. HEENT: [place] AT, moist mucus membranes.  Trachea midline, no masses. Cardiovascular: No clubbing, cyanosis, or edema. Respiratory: Normal respiratory effort, no increased work of breathing. GI: Abdomen is soft, nontender, nondistended, no abdominal masses GU: No CVA tenderness Skin: No rashes, bruises or suspicious lesions. Neurologic: Grossly intact, no focal deficits, moving all 4 extremities. Psychiatric: Normal mood and affect.  Laboratory Data: Lab Results  Component Value Date   WBC 7.2 04/27/2023   HGB 17.0 04/27/2023   HCT 51.4 04/27/2023   MCV 95.0 04/27/2023   PLT PLATELET CLUMPS NOTED ON SMEAR, UNABLE TO ESTIMATE 04/27/2023    Lab Results  Component Value Date   CREATININE 0.99 08/18/2022    No results found for: "PSA"  No results found for: "TESTOSTERONE"  No results found for: "HGBA1C"  Urinalysis    Component Value Date/Time   APPEARANCEUR Cloudy (A) 05/22/2023 0932   GLUCOSEU Negative 05/22/2023 0932   BILIRUBINUR Negative 05/22/2023 0932   PROTEINUR Trace 05/22/2023 0932   NITRITE Negative 05/22/2023 0932   LEUKOCYTESUR 1+ (A) 05/22/2023 0932    Lab Results  Component Value Date   LABMICR See below: 05/22/2023   WBCUA 6-10 (A) 05/22/2023   LABEPIT 0-10 05/22/2023   BACTERIA Moderate (A) 05/22/2023    Pertinent Imaging: N/a   Assessment & Plan:    1. Incomplete bladder emptying - disc risk of decompensation _UTI, bladder rupture, etc. Disc foley or CIC. Again he says no. Will check UDS. Cont tamsulosin. Also disc bethanechol trial (they asked) - declined. Check enal [place]. Look for stones or reflux.   2. Reflux and obstructive uropathy - rule out   - Urinalysis, Routine w reflex microscopic - BLADDER SCAN AMB NON-IMAGING   No follow-ups on file.  Jerilee Field, MD  [facility]  [address] [place], [place] [address] [phone]

## 2023-07-06 ENCOUNTER — Ambulatory Visit (HOSPITAL_COMMUNITY)
Admission: RE | Admit: 2023-07-06 | Discharge: 2023-07-06 | Disposition: A | Discharge status: Home / Self Care | Source: Ambulatory Visit | Attending: Urology | Admitting: Urology

## 2023-07-06 DIAGNOSIS — N139 Obstructive and reflux uropathy, unspecified: Secondary | ICD-10-CM | POA: Insufficient documentation

## 2023-07-06 DIAGNOSIS — R339 Retention of urine, unspecified: Secondary | ICD-10-CM | POA: Diagnosis not present

## 2023-07-06 DIAGNOSIS — N210 Calculus in bladder: Secondary | ICD-10-CM | POA: Insufficient documentation

## 2023-07-13 ENCOUNTER — Other Ambulatory Visit: Payer: Self-pay | Admitting: Family Medicine

## 2023-08-11 DIAGNOSIS — R3914 Feeling of incomplete bladder emptying: Secondary | ICD-10-CM | POA: Diagnosis not present

## 2023-08-15 ENCOUNTER — Telehealth: Payer: Self-pay | Admitting: Urology

## 2023-08-15 NOTE — Telephone Encounter (Signed)
 Could not do test at [facility] was advised to double meds and come back. Does [facility] want him to come April 21 or wait?

## 2023-08-15 NOTE — Telephone Encounter (Signed)
 FYI and advise please

## 2023-08-17 ENCOUNTER — Ambulatory Visit (INDEPENDENT_AMBULATORY_CARE_PROVIDER_SITE_OTHER): Payer: BC Managed Care – PPO | Admitting: Family Medicine

## 2023-08-17 VITALS — BP 138/77 | Ht 72.25 in | Wt 234.6 lb

## 2023-08-17 DIAGNOSIS — R931 Abnormal findings on diagnostic imaging of heart and coronary circulation: Secondary | ICD-10-CM

## 2023-08-17 DIAGNOSIS — Z0001 Encounter for general adult medical examination with abnormal findings: Secondary | ICD-10-CM

## 2023-08-17 DIAGNOSIS — Z0000 Encounter for general adult medical examination without abnormal findings: Secondary | ICD-10-CM

## 2023-08-17 DIAGNOSIS — N401 Enlarged prostate with lower urinary tract symptoms: Secondary | ICD-10-CM | POA: Diagnosis not present

## 2023-08-17 DIAGNOSIS — R350 Frequency of micturition: Secondary | ICD-10-CM

## 2023-08-17 DIAGNOSIS — E785 Hyperlipidemia, unspecified: Secondary | ICD-10-CM | POA: Diagnosis not present

## 2023-08-17 DIAGNOSIS — Z125 Encounter for screening for malignant neoplasm of prostate: Secondary | ICD-10-CM

## 2023-08-17 DIAGNOSIS — Z79899 Other long term (current) drug therapy: Secondary | ICD-10-CM

## 2023-08-17 MED ORDER — ROSUVASTATIN CALCIUM 40 MG PO TABS
40.0000 mg | ORAL_TABLET | Freq: Every day | ORAL | 1 refills | Status: DC
Start: 2023-08-17 — End: 2024-02-07

## 2023-08-17 NOTE — Progress Notes (Signed)
   Subjective:    Patient ID: Timothy Cook, male    DOB: 1965-05-31, 58 y.o.   MRN: [id number]  HPI The patient comes in today for a wellness visit.    A review of their health history was completed.  A review of medications was also completed.  Any needed refills; we will update refills  Eating habits: Relatively good eating habits  Falls/  MVA accidents in past few months: No falls or accidents  Regular exercise: Sitting in regular walking  Specialist pt sees on regular basis: Recently had implants put in for teeth  Preventative health issues were discussed.   Additional concerns: Sees urology on a regular basis for BPH    Review of Systems     Objective:   Physical Exam  General-in no acute distress Eyes-no discharge Lungs-respiratory rate normal, CTA CV-no murmurs,RRR Extremities skin warm dry no edema Neuro grossly normal Behavior normal, alert Prostate exam deferred recently completed by urology      Assessment & Plan:  1. Well adult exam (Primary) Adult wellness-complete.wellness physical was conducted today. Importance of diet and exercise were discussed in detail.  Importance of stress reduction and healthy living were discussed.  In addition to this a discussion regarding safety was also covered.  We also reviewed over immunizations and gave recommendations regarding current immunization needed for age.   In addition to this additional areas were also touched on including: Preventative health exams needed:  Colonoscopy 2028  Patient was advised yearly wellness exam   2. Hyperlipidemia, unspecified hyperlipidemia type Continue statin healthy diet keep LDL below 70 and if possible below 55 - rosuvastatin (CRESTOR) 40 MG tablet; Take 1 tablet (40 mg total) by mouth daily.  Dispense: 90 tablet; Refill: 1  3. Elevated coronary artery calcium score Patient quit smoking a year ago, eating healthy, on statin, no angina symptoms  4. Benign prostatic  hyperplasia with urinary frequency Followed by urology on 2 of the tamsulosin each evening  5. High risk medication use Screening  Very nice gentleman Overall doing well with his diet and activity Staying away from smoking Labs were ordered await results Follow-up in 6 months

## 2023-08-21 ENCOUNTER — Ambulatory Visit: Payer: PRIVATE HEALTH INSURANCE | Admitting: Urology

## 2023-09-12 ENCOUNTER — Other Ambulatory Visit: Payer: Self-pay | Admitting: Family Medicine

## 2023-09-13 ENCOUNTER — Other Ambulatory Visit: Payer: Self-pay | Admitting: Family Medicine

## 2023-09-14 ENCOUNTER — Encounter: Payer: Self-pay | Admitting: Family Medicine

## 2023-09-15 ENCOUNTER — Other Ambulatory Visit: Payer: Self-pay | Admitting: Family Medicine

## 2023-09-15 NOTE — Telephone Encounter (Signed)
 Cook, Jayce G, DO     09/15/23 11:53 AM He should talk to pharmacy or his insurance company. They likely just don't cover.

## 2023-10-03 DIAGNOSIS — L821 Other seborrheic keratosis: Secondary | ICD-10-CM | POA: Diagnosis not present

## 2023-10-03 DIAGNOSIS — L570 Actinic keratosis: Secondary | ICD-10-CM | POA: Diagnosis not present

## 2023-10-03 DIAGNOSIS — X32XXXA Exposure to sunlight, initial encounter: Secondary | ICD-10-CM | POA: Diagnosis not present

## 2023-10-11 ENCOUNTER — Other Ambulatory Visit: Payer: Self-pay | Admitting: Nurse Practitioner

## 2023-10-16 ENCOUNTER — Ambulatory Visit: Payer: PRIVATE HEALTH INSURANCE | Admitting: Urology

## 2023-11-16 ENCOUNTER — Other Ambulatory Visit: Payer: Self-pay | Admitting: Nurse Practitioner

## 2023-12-16 ENCOUNTER — Other Ambulatory Visit: Payer: Self-pay | Admitting: Nurse Practitioner

## 2023-12-18 ENCOUNTER — Other Ambulatory Visit: Payer: Self-pay

## 2023-12-18 MED ORDER — TADALAFIL 5 MG PO TABS: 5.0000 mg | ORAL_TABLET | Freq: Every day | ORAL | 0 refills | Status: DC

## 2024-01-17 ENCOUNTER — Other Ambulatory Visit: Payer: Self-pay | Admitting: Family Medicine

## 2024-01-18 NOTE — Telephone Encounter (Signed)
 Nurses-please clarify with patient-previously I saw Viagra  on his medication list-it is not advisable to be on Cialis  and Viagra  at the same time.  Therefore I can prescribe 1 or the other.  Check with him to see if he prefers the Cialis  5 mg 1 every day if so I can send in prescription with refills thank you-then obviously we will delete Viagra  thank you

## 2024-01-19 NOTE — Telephone Encounter (Signed)
 Nurses please see previous message-my position is still the same

## 2024-01-22 ENCOUNTER — Telehealth: Payer: Self-pay | Admitting: Family Medicine

## 2024-01-22 NOTE — Telephone Encounter (Signed)
 Nurses Patient is requesting Cialis  5 mg daily His med list has Viagra  He should not be on both  Pharmacy has sent several request-I sent a message back regarding this but I think that message just is not seen by the nurses within the refills  So therefore if you can verify with the patient he is only using Cialis  and not using Viagra  I can do the refills thank you

## 2024-01-23 ENCOUNTER — Encounter: Payer: Self-pay | Admitting: Family Medicine

## 2024-01-23 ENCOUNTER — Other Ambulatory Visit: Payer: Self-pay | Admitting: Family Medicine

## 2024-01-23 MED ORDER — TADALAFIL 5 MG PO TABS: 5.0000 mg | ORAL_TABLET | Freq: Every day | ORAL | 1 refills | Status: DC

## 2024-01-26 ENCOUNTER — Encounter: Payer: Self-pay | Admitting: Family Medicine

## 2024-02-01 DIAGNOSIS — Z125 Encounter for screening for malignant neoplasm of prostate: Secondary | ICD-10-CM | POA: Diagnosis not present

## 2024-02-01 DIAGNOSIS — R931 Abnormal findings on diagnostic imaging of heart and coronary circulation: Secondary | ICD-10-CM | POA: Diagnosis not present

## 2024-02-01 DIAGNOSIS — Z79899 Other long term (current) drug therapy: Secondary | ICD-10-CM | POA: Diagnosis not present

## 2024-02-01 DIAGNOSIS — E785 Hyperlipidemia, unspecified: Secondary | ICD-10-CM | POA: Diagnosis not present

## 2024-02-02 ENCOUNTER — Ambulatory Visit: Payer: Self-pay | Admitting: Family Medicine

## 2024-02-02 LAB — HEPATIC FUNCTION PANEL
ALT: 39 IU/L (ref 0–44)
AST: 35 IU/L (ref 0–40)
Albumin: 4.4 g/dL (ref 3.8–4.9)
Alkaline Phosphatase: 64 IU/L (ref 47–123)
Bilirubin Total: 0.6 mg/dL (ref 0.0–1.2)
Bilirubin, Direct: 0.18 mg/dL (ref 0.00–0.40)
Total Protein: 6.9 g/dL (ref 6.0–8.5)

## 2024-02-02 LAB — LIPID PANEL
Chol/HDL Ratio: 3.5 ratio (ref 0.0–5.0)
Cholesterol, Total: 156 mg/dL (ref 100–199)
HDL: 45 mg/dL (ref >39)
LDL Chol Calc (NIH): 90 mg/dL (ref 0–99)
Triglycerides: 117 mg/dL (ref 0–149)
VLDL Cholesterol Cal: 21 mg/dL (ref 5–40)

## 2024-02-02 LAB — BASIC METABOLIC PANEL WITH GFR
BUN/Creatinine Ratio: 11 (ref 10–24)
BUN: 13 mg/dL (ref 8–27)
CO2: 20 mmol/L (ref 20–29)
Calcium: 9.6 mg/dL (ref 8.6–10.2)
Chloride: 99 mmol/L (ref 96–106)
Creatinine, Ser: 1.19 mg/dL (ref 0.76–1.27)
Glucose: 91 mg/dL (ref 70–99)
Potassium: 4.2 mmol/L (ref 3.5–5.2)
Sodium: 138 mmol/L (ref 134–144)
eGFR: 70 mL/min/1.73 (ref >59)

## 2024-02-02 LAB — PSA: Prostate Specific Ag, Serum: 0.4 ng/mL (ref 0.0–4.0)

## 2024-02-05 ENCOUNTER — Ambulatory Visit: Admitting: Urology

## 2024-02-07 ENCOUNTER — Ambulatory Visit (INDEPENDENT_AMBULATORY_CARE_PROVIDER_SITE_OTHER): Admitting: Family Medicine

## 2024-02-07 VITALS — BP 124/81 | HR 79 | Temp 97.9°F | Ht 72.25 in | Wt 251.0 lb

## 2024-02-07 DIAGNOSIS — R635 Abnormal weight gain: Secondary | ICD-10-CM | POA: Diagnosis not present

## 2024-02-07 DIAGNOSIS — R6882 Decreased libido: Secondary | ICD-10-CM | POA: Diagnosis not present

## 2024-02-07 DIAGNOSIS — R0600 Dyspnea, unspecified: Secondary | ICD-10-CM

## 2024-02-07 DIAGNOSIS — R7989 Other specified abnormal findings of blood chemistry: Secondary | ICD-10-CM | POA: Diagnosis not present

## 2024-02-07 DIAGNOSIS — E785 Hyperlipidemia, unspecified: Secondary | ICD-10-CM

## 2024-02-07 MED ORDER — EZETIMIBE 10 MG PO TABS: 10.0000 mg | ORAL_TABLET | Freq: Every day | ORAL | 3 refills | Status: AC

## 2024-02-07 MED ORDER — TADALAFIL 5 MG PO TABS: 5.0000 mg | ORAL_TABLET | Freq: Every day | ORAL | 1 refills | Status: AC

## 2024-02-07 MED ORDER — ROSUVASTATIN CALCIUM 40 MG PO TABS: 40.0000 mg | ORAL_TABLET | Freq: Every day | ORAL | 1 refills | Status: AC

## 2024-02-07 MED ORDER — TAMSULOSIN HCL 0.4 MG PO CAPS: ORAL_CAPSULE | ORAL | 3 refills | Status: AC

## 2024-02-07 NOTE — Progress Notes (Signed)
 Subjective:    Patient ID: Timothy Cook, male    DOB: 11-29-1965, 58 y.o.   MRN: [id number]  HPI  6 month follow up  Concerns w/ occ. Diff. Breathing at random times for a couple of months he relates intermittent spells of feeling short winded these sometimes occur at rest sometimes with activity sometimes last for 30 seconds to a minute sometimes last several minutes He is able to do his work and work around the home as well and not have any chest pain or shortness of breath He does have a remote history of smoking He had a previous coronary calcium  test which showed minimal calcification  4 hour sleep-he only typically sleeps about 4 hours sometimes 5 hours does not think he has sleep apnea.  It sleeps more during the day because he works at night  Weight gain he does relate about a weight gain over the past several months also muscle weakness and feeling sluggish and fatigued and tired.  He also relates some mild difficulty thinking and processing but no forgetfulness.  In addition to this has low libido as well.    Review of Systems     Objective:   Physical Exam General-in no acute distress Eyes-no discharge Lungs-respiratory rate normal, CTA CV-no murmurs,RRR Extremities skin warm dry no edema Neuro grossly normal Behavior normal, alert  Results for orders placed or performed in visit on 08/17/23  PSA   Collection Time: 02/01/24  8:03 AM  Result Value Ref Range   Prostate Specific Ag, Serum 0.4 0.0 - 4.0 ng/mL  Basic metabolic panel with GFR   Collection Time: 02/01/24  8:03 AM  Result Value Ref Range   Glucose 91 70 - 99 mg/dL   BUN 13 8 - 27 mg/dL   Creatinine, Ser 8.80 0.76 - 1.27 mg/dL   eGFR 70 >40 fO/fpw/8.26   BUN/Creatinine Ratio 11 10 - 24   Sodium 138 134 - 144 mmol/L   Potassium 4.2 3.5 - 5.2 mmol/L   Chloride 99 96 - 106 mmol/L   CO2 20 20 - 29 mmol/L   Calcium  9.6 8.6 - 10.2 mg/dL  Lipid panel   Collection Time: 02/01/24  8:03 AM  Result Value Ref  Range   Cholesterol, Total 156 100 - 199 mg/dL   Triglycerides 882 0 - 149 mg/dL   HDL 45 >60 mg/dL   VLDL Cholesterol Cal 21 5 - 40 mg/dL   LDL Chol Calc (NIH) 90 0 - 99 mg/dL   Chol/HDL Ratio 3.5 0.0 - 5.0 ratio  Hepatic function panel   Collection Time: 02/01/24  8:03 AM  Result Value Ref Range   Total Protein 6.9 6.0 - 8.5 g/dL   Albumin 4.4 3.8 - 4.9 g/dL   Bilirubin Total 0.6 0.0 - 1.2 mg/dL   Bilirubin, Direct 9.81 0.00 - 0.40 mg/dL   Alkaline Phosphatase 64 47 - 123 IU/L   AST 35 0 - 40 IU/L   ALT 39 0 - 44 IU/L    His LDL is not quite at goal Liver enzymes look good, electrolytes sugar PSA looks good      Assessment & Plan:  1. Dyspnea, unspecified type (Primary) The spells could be related to stress it even possibly may be having some occasional panic attacks but we need to rule out other entities.  I would recommend an up-to-date chest x-ray.  In addition to this pulmonary function test necessary as well.  May or may not need additional measures or  medicines.  Does not need pulmonary consult currently - DG Chest 2 View - CT CHEST LUNG CA SCREEN LOW DOSE W/O CM - Pulmonary function test  2. Low libido Is possible this could be related to low testosterone .  Also his work schedule as well.  And changes that come with age  6. Weight gain Portion control regular physical activity cutting back on simple carbs recommended.  4. Low testosterone  in male Patient will check lab work await the results - Testosterone   5. Hyperlipidemia, unspecified hyperlipidemia type Go ahead with rosuvastatin  but we are also adding Zetia that should help get things in better control we will do full lab work by spring time follow-up in the spring - rosuvastatin  (CRESTOR ) 40 MG tablet; Take 1 tablet (40 mg total) by mouth daily.  Dispense: 90 tablet; Refill: 1

## 2024-02-15 ENCOUNTER — Ambulatory Visit (HOSPITAL_COMMUNITY)
Admission: RE | Admit: 2024-02-15 | Discharge: 2024-02-15 | Disposition: A | Discharge status: Home / Self Care | Source: Ambulatory Visit | Attending: Family Medicine | Admitting: Family Medicine

## 2024-02-15 DIAGNOSIS — R0600 Dyspnea, unspecified: Secondary | ICD-10-CM | POA: Insufficient documentation

## 2024-02-16 ENCOUNTER — Ambulatory Visit: Payer: Self-pay | Admitting: Family Medicine

## 2024-02-16 LAB — TESTOSTERONE: Testosterone: 449 ng/dL (ref 264–916)

## 2024-02-22 NOTE — Addendum Note (Signed)
 Addended by: DELORES LIONEL RAMAN on: 02/22/2024 03:11 PM   Modules accepted: Orders

## 2024-02-23 ENCOUNTER — Encounter: Payer: Self-pay | Admitting: *Deleted

## 2024-04-19 ENCOUNTER — Encounter: Payer: Self-pay | Admitting: Internal Medicine

## 2024-04-19 ENCOUNTER — Ambulatory Visit (INDEPENDENT_AMBULATORY_CARE_PROVIDER_SITE_OTHER): Admitting: Internal Medicine

## 2024-04-19 VITALS — BP 134/72 | HR 74 | Ht 72.03 in | Wt 257.0 lb

## 2024-04-19 DIAGNOSIS — R058 Other specified cough: Secondary | ICD-10-CM | POA: Insufficient documentation

## 2024-04-19 DIAGNOSIS — Z87891 Personal history of nicotine dependence: Secondary | ICD-10-CM | POA: Insufficient documentation

## 2024-04-19 DIAGNOSIS — R0609 Other forms of dyspnea: Secondary | ICD-10-CM | POA: Insufficient documentation

## 2024-04-19 MED ORDER — BENZONATATE 200 MG PO CAPS: 200.0000 mg | ORAL_CAPSULE | Freq: Three times a day (TID) | ORAL | 1 refills | Status: AC | PRN

## 2024-04-19 NOTE — Patient Instructions (Addendum)
 My office will be contacting you by phone for referral to lung cancer screening   [phone]) - if you don't hear back from my office within one week,  please call us  back or notify us  thru MyChart and we'll address it right away.    Pantoprazole (protonix) 40 mg   Take  30-60 min before first meal of the day and Pepcid (famotidine)  20 mg after supper until return to office - this is the best way to tell whether stomach acid is contributing to your problem.    GERD (REFLUX)  is an extremely common cause of respiratory symptoms just like yours , many times with no obvious heartburn at all.    It can be treated with medication, but also with lifestyle changes including elevation of the head of your bed (ideally with 6 -8inch blocks under the headboard of your bed),  Smoking cessation, avoidance of late meals, excessive alcohol, and avoid fatty foods, chocolate, peppermint, colas, red wine, and acidic juices such as orange juice.  NO MINT OR MENTHOL PRODUCTS SO NO COUGH DROPS  USE SUGARLESS CANDY INSTEAD (Jolley ranchers or Stover's or Life Savers) or even ice chips will also do - the key is to swallow to prevent all throat clearing. NO OIL BASED VITAMINS - use powdered substitutes.  Avoid fish oil when coughing.   Delsym 2 tsp every 12 hours  supplement with Tessalon 200 every 6 hours as needed   Please remember to go to the lab department   for your tests - we will call you with the results when they are available.      Please schedule a follow up office visit in 6 weeks, call sooner if needed  with PFTs next available

## 2024-04-19 NOTE — Assessment & Plan Note (Addendum)
 Allergy screen 04/19/2024 >  Eos 0. /  IgE  pending  - 04/19/2024 max gerd rx   Upper airway cough syndrome (previously labeled PNDS),  is so named because it's frequently impossible to sort out how much is  CR/sinusitis with freq throat clearing (which can be related to primary GERD)   vs  causing  secondary ( extra esophageal)  GERD from wide swings in gastric pressure that occur with throat clearing, often  promoting self use of mint and menthol lozenges that reduce the lower esophageal sphincter tone and exacerbate the problem further in a cyclical fashion.   These are the same pts (now being labeled as having irritable larynx syndrome by some cough centers) who not infrequently have a history of having failed to tolerate ace inhibitors,  dry powder inhalers or biphosphonates or report having atypical/extraesophageal reflux symptoms from LPR (globus, throat clearing)  that don't respond to standard doses of PPI  and are easily confused as having aecopd or asthma flares by even experienced allergists/ pulmonologists (myself included).   Rec >>> max gerd rx  >>> suppress cough with delsym / tessalon 200 for now    F/u 6 weeks         Each maintenance medication was reviewed in detail including emphasizing most importantly the difference between maintenance and prns and under what circumstances the prns are to be triggered using an action plan format where appropriate.  Total time for H and P, chart review, counseling, reviewing   and generating customized AVS unique to this office visit / same day charting = 63 min new pt eval for multiple  refractory respiratory  symptoms of uncertain etiology

## 2024-04-19 NOTE — Progress Notes (Signed)
 "   Timothy Cook, male    DOB: [birth year]    MRN: [id number]   Brief patient profile:  58  yowm from [facility] [place]  passive exp to cigs with frequent bronchitis winter but improved  since lived [place]  and quit smoking completely 08/2022 due to dental issues  referred to pulmonary clinic in [place]  04/19/2024 by Dr Kirkwood Fielding  for doe with 40 lb of wt gain.   Good year tire and rubber 2022 exposed to [employer] s respirator  Pt not previously seen by PCCM service.     History of Present Illness  04/19/2024  Pulmonary/ 1st office eval/ Timothy Cook / [place] [facility]  Chief Complaint  Patient presents with   Establish Care    Shob while sitting - also inconsistent When goes to sleep having coughing spells (brown)      Dyspnea:  still able to walk  fast and up steps / sob at rest assoc with new onset cough x 4 m Cough: worse x 4 months / worse with laughter occ food comes up gag /vomit  and loss voice during episodes  Sleep: bed is adjustable but no change with elevation. SABA use: none  02: none   No obvious day to day or daytime pattern/variability or assoc  mucus plugs or hemoptysis or cp or chest tightness, subjective wheeze or overt sinus or hb symptoms.   Also denies any obvious fluctuation of symptoms with weather or environmental changes or other aggravating or alleviating factors except as outlined above   No unusual exposure hx or h/o childhood pna/ asthma or knowledge of premature birth.  Current Allergies, Complete Past Medical History, Past Surgical History, Family History, and Social History were reviewed in [employer] record.  ROS  The following are not active complaints unless bolded Hoarseness, sore throat, dysphagia, dental problems, itching, sneezing,  nasal congestion or discharge of excess mucus or purulent secretions, ear ache,   fever, chills, sweats, unintended wt loss or wt gain, classically pleuritic or exertional cp,  orthopnea pnd or  arm/hand swelling  or leg swelling, presyncope, palpitations, abdominal pain, anorexia, nausea, vomiting, diarrhea  or change in bowel habits or change in bladder habits, change in stools or change in urine, dysuria, hematuria,  rash, arthralgias, visual complaints, headache, numbness, weakness or ataxia or problems with walking or coordination,  change in mood or  memory.            Outpatient Medications Prior to Visit  Medication Sig Dispense Refill   acetaminophen (TYLENOL) 500 MG tablet Take 500-1,000 mg by mouth every 6 (six) hours as needed (pain).     aspirin  EC 81 MG tablet Take 1 tablet (81 mg total) by mouth daily. Swallow whole. 90 tablet 2   cyclobenzaprine  (FLEXERIL ) 10 MG tablet TAKE 1 TABLET BY MOUTH EVERY DAY AT BEDTIME FOR MUSCLE SPASMS AS NEEDED 30 tablet 1   ezetimibe  (ZETIA ) 10 MG tablet Take 1 tablet (10 mg total) by mouth daily. 90 tablet 3   ibuprofen (ADVIL) 200 MG tablet Take 400-600 mg by mouth every 8 (eight) hours as needed (pain.).     rosuvastatin  (CRESTOR ) 40 MG tablet Take 1 tablet (40 mg total) by mouth daily. 90 tablet 1   tadalafil  (CIALIS ) 5 MG tablet Take 1 tablet (5 mg total) by mouth daily. 30 tablet 1   tamsulosin  (FLOMAX ) 0.4 MG CAPS capsule 2 qd 180 capsule 3   No facility-administered medications prior to visit.  Past Medical History:  Diagnosis Date   Arthritis    BPH (benign prostatic hyperplasia)    History of hiatal hernia    History of kidney stones       Objective:     BP 134/72   Pulse 74   Ht 6' 0.03 (1.83 m)   Wt 257 lb (116.6 kg)   SpO2 98% Comment: ra  BMI 34.83 kg/m   SpO2: 98 % (ra) pleasant amb muscular mod obese (by bmi) wm nad    HEENT : Oropharynx  clear/edentulous      Nasal turbinates mod edema  L >R    NECK :  without  apparent JVD/ palpable Nodes/TM    LUNGS: no acc muscle use,  Nl contour chest which is clear to A and P bilaterally without cough on insp or exp maneuvers   CV:  RRR  no s3 or murmur  or increase in P2, and no edema   ABD:  obese soft and nontender   MS:  Gait nl   ext warm without deformities Or obvious joint restrictions  calf tenderness, cyanosis or clubbing    SKIN: warm and dry without lesions    NEURO:  alert, approp, nl sensorium with  no motor or cerebellar deficits apparent.    I personally reviewed images and agree with radiology impression as follows:  CXR:  Pa and lateral 02/15/24  Nl cxr      Assessment   Assessment & Plan DOE (dyspnea on exertion) Not reproducible with exertion so likely related to UACS (see sep a/p) so rec PFTs but no need for direct rx for now.   Former cigarette smoker Low-dose CT lung cancer screening is recommended for patients who are 47-90 years of age with a 20+ pack-year history of smoking and who are currently smoking or quit <=15 years ago. No coughing up blood  No unintentional weight loss of > 15 pounds in the last 6 months - pt is eligible for scanning yearly until 2039    Upper airway cough syndrome Allergy screen 04/19/2024 >  Eos 0. /  IgE  pending  - 04/19/2024 max gerd rx   Upper airway cough syndrome (previously labeled PNDS),  is so named because it's frequently impossible to sort out how much is  CR/sinusitis with freq throat clearing (which can be related to primary GERD)   vs  causing  secondary ( extra esophageal)  GERD from wide swings in gastric pressure that occur with throat clearing, often  promoting self use of mint and menthol lozenges that reduce the lower esophageal sphincter tone and exacerbate the problem further in a cyclical fashion.   These are the same pts (now being labeled as having irritable larynx syndrome by some cough centers) who not infrequently have a history of having failed to tolerate ace inhibitors,  dry powder inhalers or biphosphonates or report having atypical/extraesophageal reflux symptoms from LPR (globus, throat clearing)  that don't respond to standard doses of PPI   and are easily confused as having aecopd or asthma flares by even experienced allergists/ pulmonologists (myself included).   Rec >>> max gerd rx  >>> suppress cough with delsym / tessalon  200 for now    F/u 6 weeks         Each maintenance medication was reviewed in detail including emphasizing most importantly the difference between maintenance and prns and under what circumstances the prns are to be triggered using an action plan format where appropriate.  Total  time for H and P, chart review, counseling, reviewing   and generating customized AVS unique to this office visit / same day charting = 63 min new pt eval for multiple  refractory respiratory  symptoms of uncertain etiology          AVS  Patient Instructions  My office will be contacting you by phone for referral to lung cancer screening   [phone] - if you don't hear back from my office within one week,  please call us  back or notify us  thru MyChart and we'll address it right away.    Pantoprazole (protonix) 40 mg   Take  30-60 min before first meal of the day and Pepcid (famotidine)  20 mg after supper until return to office - this is the best way to tell whether stomach acid is contributing to your problem.    GERD (REFLUX)  is an extremely common cause of respiratory symptoms just like yours , many times with no obvious heartburn at all.    It can be treated with medication, but also with lifestyle changes including elevation of the head of your bed (ideally with 6 -8inch blocks under the headboard of your bed),  Smoking cessation, avoidance of late meals, excessive alcohol, and avoid fatty foods, chocolate, peppermint, colas, red wine, and acidic juices such as orange juice.  NO MINT OR MENTHOL PRODUCTS SO NO COUGH DROPS  USE SUGARLESS CANDY INSTEAD (Jolley ranchers or Stover's or Life Savers) or even ice chips will also do - the key is to swallow to prevent all throat clearing. NO OIL BASED VITAMINS - use  powdered substitutes.  Avoid fish oil when coughing.   Delsym 2 tsp every 12 hours  supplement with Tessalon  200 every 6 hours as needed   Please remember to go to the lab department   for your tests - we will call you with the results when they are available.      Please schedule a follow up office visit in 6 weeks, call sooner if needed  with PFTs next available              Ozell America, MD 04/19/2024      "

## 2024-04-19 NOTE — Assessment & Plan Note (Addendum)
 Low-dose CT lung cancer screening is recommended for patients who are 78-60 years of age with a 20+ pack-year history of smoking and who are currently smoking or quit <=15 years ago. No coughing up blood  No unintentional weight loss of > 15 pounds in the last 6 months - pt is eligible for scanning yearly until 2039

## 2024-04-19 NOTE — Assessment & Plan Note (Addendum)
 Not reproducible with exertion so likely related to UACS (see sep a/p) so rec PFTs but no need for direct rx for now.

## 2024-04-22 LAB — CBC WITH DIFFERENTIAL/PLATELET
Basophils Absolute: 0.1 x10E3/uL (ref 0.0–0.2)
Basos: 1 %
EOS (ABSOLUTE): 0.4 x10E3/uL (ref 0.0–0.4)
Eos: 4 %
Hematocrit: 48.4 % (ref 37.5–51.0)
Hemoglobin: 16.3 g/dL (ref 13.0–17.7)
Immature Grans (Abs): 0.2 x10E3/uL — ABNORMAL HIGH (ref 0.0–0.1)
Immature Granulocytes: 2 %
Lymphocytes Absolute: 1.7 x10E3/uL (ref 0.7–3.1)
Lymphs: 19 %
MCH: 32.3 pg (ref 26.6–33.0)
MCHC: 33.7 g/dL (ref 31.5–35.7)
MCV: 96 fL (ref 79–97)
Monocytes Absolute: 1 x10E3/uL — ABNORMAL HIGH (ref 0.1–0.9)
Monocytes: 11 %
Neutrophils Absolute: 5.5 x10E3/uL (ref 1.4–7.0)
Neutrophils: 63 %
RBC: 5.04 x10E6/uL (ref 4.14–5.80)
RDW: 12.4 % (ref 11.6–15.4)
WBC: 8.7 x10E3/uL (ref 3.4–10.8)

## 2024-04-22 LAB — TSH: TSH: 2.56 u[IU]/mL (ref 0.450–4.500)

## 2024-04-22 LAB — IGE: IgE (Immunoglobulin E), Serum: 84 [IU]/mL (ref 6–495)

## 2024-04-29 ENCOUNTER — Other Ambulatory Visit: Payer: Self-pay | Admitting: *Deleted

## 2024-04-29 ENCOUNTER — Telehealth: Payer: Self-pay | Admitting: *Deleted

## 2024-04-29 DIAGNOSIS — Z87891 Personal history of nicotine dependence: Secondary | ICD-10-CM

## 2024-04-29 DIAGNOSIS — Z122 Encounter for screening for malignant neoplasm of respiratory organs: Secondary | ICD-10-CM

## 2024-04-29 NOTE — Telephone Encounter (Signed)
 Lung Cancer Screening Narrative/Criteria Questionnaire (Cigarette Smokers Only- No Cigars/Pipes/vapes)   Timothy Cook   SDMV:04/30/24 11:30- [place]                                           [birth year]              LDCT: 05/03/24 7:30- AP    58 y.o.   Phone: [phone]  Lung Screening Narrative (confirm age 51-77 yrs Medicare / 50-80 yrs Private pay insurance)   Insurance information:BCBS   Referring Provider:[facility]   This screening involves an initial phone call with a team member from our program. It is called a shared decision making visit. The initial meeting is required by insurance and Medicare to make sure you understand the program. This appointment takes about 15-20 minutes to complete. The CT scan will completed at a separate date/time. This scan takes about 5-10 minutes to complete and you may eat and drink before and after the scan.  Criteria questions for Lung Cancer Screening:   Are you a current or former smoker? Former Age began smoking: 10   If you are a former smoker, what year did you quit smoking? Quit 2 yrs ago and a total of 20 yrs in between (within 15 yrs)   To calculate your smoking history, I need an accurate estimate of how many packs of cigarettes you smoked per day and for how many years. (Not just the number of PPD you are now smoking)   Years smoking 28 x Packs per day 1/2-2/3 = Pack years 20   (at least 20 pack yrs)   (Make sure they understand that we need to know how much they have smoked in the past, not just the number of PPD they are smoking now)  Do you have a personal history of cancer?  No    Do you have a family history of cancer? Yes  (cancer type and and relative) Mother (lung) Father (Prostate)  Are you coughing up blood?  No  Have you had unexplained weight loss of 15 lbs or more in the last 6 months? No  It looks like you meet all criteria.     Additional information: N/A

## 2024-04-30 ENCOUNTER — Ambulatory Visit: Payer: Self-pay | Admitting: Internal Medicine

## 2024-04-30 ENCOUNTER — Ambulatory Visit

## 2024-04-30 DIAGNOSIS — Z122 Encounter for screening for malignant neoplasm of respiratory organs: Secondary | ICD-10-CM

## 2024-04-30 DIAGNOSIS — Z87891 Personal history of nicotine dependence: Secondary | ICD-10-CM | POA: Diagnosis not present

## 2024-04-30 LAB — ALPHA-1-ANTITRYPSIN PHENOTYP: A-1 Antitrypsin: 155 mg/dL (ref 101–187)

## 2024-04-30 NOTE — Patient Instructions (Addendum)
 Thank you for participating in the [facility]. It was our pleasure to meet you today. We will call you with the results of your scan within the next few days. Your scan will be assigned a Lung RADS category score by the physicians reading the scans.  This Lung RADS score determines follow up scanning.  See below for description of categories, and follow up screening recommendations. We will be in touch to schedule your follow up screening annually or based on recommendations of our providers. We will fax a copy of your scan results to your Primary Care Physician, or the physician who referred you to the program, to ensure they have the results. Please call the office if you have any questions or concerns regarding your scanning experience or results.  Our office number is [phone]. Please speak with Karna Curly, RN., Karna Doom RN, or [facility] RN, and Isaiah Dover RN. They are  our Lung Cancer Screening RN.'s If They are unavailable when you call, Please leave a message on the voice mail. We will return your call at our earliest convenience.This voice mail is monitored several times a day.  Remember, if your scan is normal, we will scan you annually as long as you continue to meet the criteria for the program. (Age 7-80, Current smoker or smoker who has quit within the last 15 years). If you are a smoker, remember, quitting is the single most powerful action that you can take to decrease your risk of lung cancer and other pulmonary, breathing related problems. We know quitting is hard, and we are here to help.  Please let us  know if there is anything we can do to help you meet your goal of quitting. If you are a former smoker, counselling psychologist. We are proud of you! Remain smoke free! Remember you can refer friends or family members through the number above.  We will screen them to make sure they meet criteria for the program. Thank you for helping us   take better care of you by participating in Lung Screening.  For Virtual Smoking Cessation Classes , The American [employer] Association Provides  Freedom From Smoking Classes.  Please search their website for dates and times.    Lung RADS Categories:  Lung RADS 1: no nodules or definitely non-concerning nodules.  Recommendation is for a repeat annual scan in 12 months.  Lung RADS 2:  nodules that are non-concerning in appearance and behavior with a very low likelihood of becoming an active cancer. Recommendation is for a repeat annual scan in 12 months.  Lung RADS 3: nodules that are probably non-concerning , includes nodules with a low likelihood of becoming an active cancer.  Recommendation is for a 78-month repeat screening scan. Often noted after an upper respiratory illness. We will be in touch to make sure you have no questions, and to schedule your 61-month scan.  Lung RADS 4 A: nodules with concerning findings, recommendation is most often for a follow up scan in 3 months or additional testing based on our provider's assessment of the scan. We will be in touch to make sure you have no questions and to schedule the recommended 3 month follow up scan.  Lung RADS 4 B:  indicates findings that are concerning. We will be in touch with you to schedule additional diagnostic testing based on our provider's  assessment of the scan.  You can receive free nicotine replacement therapy ( patches, gum or mints) by calling 1-800-QUIT NOW.  Please call so we can get you on the path to becoming  a non-smoker. I know it is hard, but you can do this!  Other options for assistance in smoking cessation ( As covered by your insurance benefits)  Hypnosis for smoking cessation  [employer] [phone]  Acupuncture for smoking cessation  [facility] [phone]    Your CT scan is scheduled for 05/03/2024 at 7:30 am at [facility].

## 2024-04-30 NOTE — Progress Notes (Signed)
 Virtual Visit via Telephone Note  I connected with Timothy Cook on 04/30/2024 at 11:30 AM EST by telephone and verified that I am speaking with the correct person using two identifiers.  Location: Patient: At home, in [place] Provider: [address] [place], [place], [address]    I discussed the limitations, risks, security and privacy concerns of performing an evaluation and management service by telephone and the availability of in person appointments. I also discussed with the patient that there may be a patient responsible charge related to this service. The patient expressed understanding and agreed to proceed.  Shared Decision Making Visit Lung Cancer Screening Program [phone])   Eligibility: Age 58 y.o. Pack Years Smoking History Calculation 20 pack years  (# packs/per year x # years smoked) Recent History of coughing up blood  no Unexplained weight loss? no ( >Than 15 pounds within the last 6 months ) Prior History Lung / other cancer no (Diagnosis within the last 5 years already requiring surveillance chest CT Scans). Smoking Status Former Smoker Former Smokers: Years since quit: 2 years  Quit Date: 08/2022  Visit Components: Discussion included one or more decision making aids. yes Discussion included risk/benefits of screening. yes Discussion included potential follow up diagnostic testing for abnormal scans. yes Discussion included meaning and risk of over diagnosis. yes Discussion included meaning and risk of False Positives. yes Discussion included meaning of total radiation exposure. yes  Counseling Included: Importance of adherence to annual lung cancer LDCT screening. yes Impact of comorbidities on ability to participate in the program. yes Ability and willingness to under diagnostic treatment. yes  Smoking Cessation Counseling: Current Smokers:  Discussed importance of smoking cessation. N/A Information about tobacco cessation classes and interventions  provided to patient. N/A Patient provided with ticket for LDCT Scan. N/A Symptomatic Patient. N/A  Counseling Diagnosis Code: Tobacco Use Z72.0 Asymptomatic Patient N/A  Counseling  Former Smokers:  Discussed the importance of maintaining cigarette abstinence. yes Diagnosis Code: Personal History of Nicotine Dependence. S12.108 Information about tobacco cessation classes and interventions provided to patient. Yes Patient provided with ticket for LDCT Scan. N/A Written Order for Lung Cancer Screening with LDCT placed in Epic. Yes (CT Chest Lung Cancer Screening Low Dose W/O CM) [id number] Z12.2-Screening of respiratory organs Z87.891-Personal history of nicotine dependence  Shared decision visit completed by Wells Georgia, FNP as a registered nurse awaiting credentialing.    Wells CHRISTELLA Georgia, FNP

## 2024-05-03 ENCOUNTER — Ambulatory Visit (HOSPITAL_COMMUNITY)
Admission: RE | Admit: 2024-05-03 | Discharge: 2024-05-03 | Disposition: A | Discharge status: Home / Self Care | Source: Ambulatory Visit | Attending: Acute Care | Admitting: Acute Care

## 2024-05-03 DIAGNOSIS — Z87891 Personal history of nicotine dependence: Secondary | ICD-10-CM | POA: Diagnosis present

## 2024-05-03 DIAGNOSIS — Z122 Encounter for screening for malignant neoplasm of respiratory organs: Secondary | ICD-10-CM | POA: Insufficient documentation

## 2024-05-06 ENCOUNTER — Other Ambulatory Visit: Payer: Self-pay

## 2024-05-06 DIAGNOSIS — Z87891 Personal history of nicotine dependence: Secondary | ICD-10-CM

## 2024-05-06 DIAGNOSIS — Z122 Encounter for screening for malignant neoplasm of respiratory organs: Secondary | ICD-10-CM

## 2024-06-11 ENCOUNTER — Ambulatory Visit (HOSPITAL_COMMUNITY)

## 2024-06-19 ENCOUNTER — Ambulatory Visit: Admitting: Internal Medicine

## 2024-08-07 ENCOUNTER — Ambulatory Visit: Admitting: Family Medicine

## 2024-08-19 ENCOUNTER — Ambulatory Visit: Admitting: Internal Medicine
